# Patient Record
Sex: Male | Born: 1960
Health system: Southern US, Community
[De-identification: ages and names within clinical notes are randomized; demographics above are authoritative.]

## PROBLEM LIST (undated history)

## (undated) DIAGNOSIS — G039 Meningitis, unspecified: Secondary | ICD-10-CM

## (undated) DIAGNOSIS — Z8619 Personal history of other infectious and parasitic diseases: Secondary | ICD-10-CM

## (undated) DIAGNOSIS — L309 Dermatitis, unspecified: Secondary | ICD-10-CM

## (undated) DIAGNOSIS — H9190 Unspecified hearing loss, unspecified ear: Secondary | ICD-10-CM

## (undated) HISTORY — PX: DENTAL SURGERY: SHX609

## (undated) HISTORY — PX: MIDDLE EAR SURGERY: SHX713

---

## 1997-06-04 HISTORY — PX: BACK SURGERY: SHX140

## 1998-01-18 ENCOUNTER — Emergency Department (HOSPITAL_COMMUNITY): Admission: EM | Admit: 1998-01-18 | Discharge: 1998-01-18 | Payer: Self-pay | Admitting: Emergency Medicine

## 1998-01-19 ENCOUNTER — Emergency Department (HOSPITAL_COMMUNITY): Admission: EM | Admit: 1998-01-19 | Discharge: 1998-01-19 | Payer: Self-pay | Admitting: Emergency Medicine

## 1998-07-16 ENCOUNTER — Emergency Department (HOSPITAL_COMMUNITY): Admission: EM | Admit: 1998-07-16 | Discharge: 1998-07-16 | Payer: Self-pay | Admitting: Emergency Medicine

## 1998-08-26 ENCOUNTER — Encounter: Payer: Self-pay | Admitting: Neurosurgery

## 1998-08-29 ENCOUNTER — Encounter: Payer: Self-pay | Admitting: Neurosurgery

## 1998-08-29 ENCOUNTER — Inpatient Hospital Stay (HOSPITAL_COMMUNITY): Admission: RE | Admit: 1998-08-29 | Discharge: 1998-08-30 | Payer: Self-pay | Admitting: Neurosurgery

## 2002-09-18 ENCOUNTER — Ambulatory Visit (HOSPITAL_BASED_OUTPATIENT_CLINIC_OR_DEPARTMENT_OTHER): Admission: RE | Admit: 2002-09-18 | Discharge: 2002-09-18 | Payer: Self-pay | Admitting: Orthopedic Surgery

## 2006-12-14 ENCOUNTER — Emergency Department (HOSPITAL_COMMUNITY): Admission: EM | Admit: 2006-12-14 | Discharge: 2006-12-14 | Payer: Self-pay | Admitting: Family Medicine

## 2007-12-27 ENCOUNTER — Emergency Department (HOSPITAL_BASED_OUTPATIENT_CLINIC_OR_DEPARTMENT_OTHER): Admission: EM | Admit: 2007-12-27 | Discharge: 2007-12-27 | Payer: Self-pay | Admitting: Emergency Medicine

## 2008-09-03 ENCOUNTER — Emergency Department (HOSPITAL_COMMUNITY): Admission: EM | Admit: 2008-09-03 | Discharge: 2008-09-04 | Payer: Self-pay | Admitting: Emergency Medicine

## 2009-02-22 ENCOUNTER — Emergency Department (HOSPITAL_COMMUNITY): Admission: EM | Admit: 2009-02-22 | Discharge: 2009-02-22 | Payer: Self-pay | Admitting: Emergency Medicine

## 2010-06-02 ENCOUNTER — Encounter
Admission: RE | Admit: 2010-06-02 | Discharge: 2010-06-02 | Payer: Self-pay | Source: Home / Self Care | Attending: Otolaryngology | Admitting: Otolaryngology

## 2010-06-29 ENCOUNTER — Ambulatory Visit
Admission: RE | Admit: 2010-06-29 | Discharge: 2010-06-30 | Payer: Self-pay | Source: Home / Self Care | Attending: Otolaryngology | Admitting: Otolaryngology

## 2010-06-29 LAB — POCT HEMOGLOBIN-HEMACUE: Hemoglobin: 17.3 g/dL — ABNORMAL HIGH (ref 13.0–17.0)

## 2010-07-06 ENCOUNTER — Ambulatory Visit (INDEPENDENT_AMBULATORY_CARE_PROVIDER_SITE_OTHER): Payer: Self-pay | Admitting: Otolaryngology

## 2010-07-13 NOTE — Op Note (Signed)
Russell Warren, Russell Warren             ACCOUNT NO.:  [id number]  MEDICAL RECORD NO.:  [id number]          PATIENT TYPE:  AMB  LOCATION:  DSC                          FACILITY:  [facility]  PHYSICIAN:  Newman Pies, MD            DATE OF BIRTH:  1960-07-17  DATE OF PROCEDURE:  06/29/2010 DATE OF DISCHARGE:                              OPERATIVE REPORT   SURGEON:  Newman Pies, MD.  PREOPERATIVE DIAGNOSIS:  Left chronic otomastoiditis.  POSTOPERATIVE DIAGNOSES: 1. Left chronic otomastoiditis. 2. 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  PROCEDURE PERFORMED: 1. Canal wall down tympanomastoidectomy with ossicular chain     reconstruction (incus interposition graft). 2. Microscope Microdissection.  ANESTHESIA:  General endotracheal tube anesthesia.  COMPLICATIONS:  None.  ESTIMATED BLOOD LOSS:  Less than 50 mL.  INDICATIONS FOR PROCEDURE:  The patient is a 50 year old male with a history of chronic left ear infections and hearing loss.  It should be noted that the patient previously had a history of right tympanomastoidectomy as a child to treat his chronic right ear infections.  Over the past several years, he has been having progressive hearing loss on the left side.  He also complains of significant tinnitus on the left side.  He has also been experiencing recurrent drainage in the left ear.  On examination, he was noted to have chronic purulent drainage from the left ear canal.  He was treated with multiple courses of antibiotics without improvement in his symptoms.  His tympanic membrane was noted to be severely retracted, with possible erosion of his ossicles.  A temporal bone CT scan was performed.  It showed complete opacification of his mastoid air cells as well as opacification of the left mastoid cavity.  The findings were suggestive of chronic oral mastoiditis with possible cholesteatoma.  Based on the above findings, the decision was made for the patient  to undergo the left tympanomastoidectomy surgery.  The risks, benefits, alternatives, and details of the procedure were discussed with the patient and his wife.  Questions were invited and answered.  Informed consent was obtained.  DESCRIPTION:  The patient was taken to the operating room and placed supine on the operating table.  General endotracheal tube anesthesia was administered by the anesthesiologist.  The patient was positioned, and prepped and draped in standard fashion for left ear surgery.  Facial nerve monitoring electrodes were placed.  The facial nerve system was functional throughout the case.  Under the operating microscope, the left ear canal was cleaned of all cerumen.  The patient's left tympanic membrane was noted to be severely retracted.  A small amount of purulent drainage was noted.  A standard tympanomeatal flap was elevated in the standard fashion.  The retracted tympanic membrane was carefully elevated.  At this time, the long process of the incus was noted to be completely eroded.  However, the stapes was still intact.  The malleus and the incus were subsequently removed.  A large amount of granulation tissue was noted to occupy the superior portion of the middle ear cavity.  Attention was then focused on  the mastoid.  A standard cortical mastoidectomy was performed in the standard fashion.  The tegmen and the sigmoid sinus were identified and preserved.  The antrum was subsequently identified and enlarged.  Chronic mucosal polypoid tissue was noted.  It was carefully removed.  At this point, it was noted that the retracted tympanic membrane was noted to be draping the sinus tympani.  In order to completely remove all the squamous tissue and infection, the decision was made to perform the canal wall down procedure.  The posterior canal wall was carefully removed.  The bone ridge was lowered to the level of the facial nerve.  The polypoid tissue around the  stapes was carefully removed.  The mastoid cavity and the middle ear cavity were irrigated with Ciprodex antibiotic solution.  In order to reconstruct the hearing apparatus, the head of the incus was carefully drilled and fashioned into an incus interposition graft.  The mushroom cap-shaped interposition graft was then placed on top of the stapes suprastructure.  Temporalis fascia graft was then harvested in a standard fashion.  The fascial graft was used to reconstruct the new tympanum.  The new tympanic membrane was secured in place with Gelfoam packing on both sides of the new eardrum.  The new tympanum was then used to cover the incus interposition graft.  A standard meatoplasty was performed to significantly enlarge the size of the ear canal.  The incision was closed in layers with 4-0 Vicryl and Dermabond sutures.  A Glasscock dressing was applied.  That concluded the procedure for the patient.  The care of the patient was turned over to the anesthesiologist.  The patient was awakened from anesthesia without difficulty.  He was extubated and transferred to the recovery room in good condition.  OPERATIVE FINDINGS:  Chronic left oral mastoiditis, with severe tympanic membrane retraction and erosion of the long process of the incus.  An incus interposition graft was used to reconstruct the ossicular chain.  SPECIMEN:  None.  FOLLOWUP CARE:  The patient will be discharged home once he is awake and alert.  He will be placed on Percocet 1-2 tablets p.o. q.4-6 h. p.r.n. pain, and ciprofloxacin 500 mg p.o. b.i.d. for 5 days.  The patient will follow up in my office in approximately 1 week.     Newman Pies, MD     ST/MEDQ  D:  06/29/2010  T:  06/29/2010  Job:  [id number]  Electronically Signed by Newman Pies MD on 07/13/2010 12:31:20 PM

## 2010-10-20 NOTE — Op Note (Signed)
NAME:  Russell Warren, Russell Warren                       ACCOUNT NO.:  [id number]   MEDICAL RECORD NO.:  [id number]                   PATIENT TYPE:  AMB   LOCATION:  DSC                                  FACILITY:  [facility]   PHYSICIAN:  Harvie Junior, M.D.                DATE OF BIRTH:  20-Jul-1960   DATE OF PROCEDURE:  09/18/2002  DATE OF DISCHARGE:                                 OPERATIVE REPORT   PREOPERATIVE DIAGNOSIS:  Medial meniscal tear.   POSTOPERATIVE DIAGNOSIS:  Medial meniscal tear.   PROCEDURE:  Operative knee arthroscopy with debridement of bucket handle  medial meniscal tear.   SURGEON:  Harvie Junior, M.D.   ASSISTANT:  Marshia Ly, P.A.   ANESTHESIA:  General.   BRIEF HISTORY:  This is a 50 year old male with a long history of having had  significant medial-side pain.  He ultimately had a twisting maneuver and the  knee locked him on and he was unable to straighten it, was not able to bend  it.  Because of significant continued complaints of pain, the patient was  brought to the operating room for operative knee arthroscopy.  MRI was  obtained preoperatively by Scherrie Bateman, M.D., and showed a medial meniscal  tear.   DESCRIPTION OF PROCEDURE:  The patient was brought to the operating room and  after adequate anesthesia was obtained with a general anesthetic, the  patient was placed supine upon the operating table.  The right leg was then  prepped and draped in the usual sterile fashion.  Following this, routine  arthroscopic examination of the knee was undertaken and noted to have a  bucket handle medial meniscal tear attached from the front and the side  around to the back.  It was locked in the knee.  The bucket handle tear was  reduced, detached from the back, detached from the front, and removed in  total.  A 4 mm meniscal rim was left and about 5-6 posteriorly.  The knee  was copiously irrigated at this point.  The medial femoral condyle had some  area of  excessive wear in the extension area.  This was debrided to a smooth  and stable rim.  Following debridement of this, attention was turned to the  anterior cruciate, which was normal, lateral side normal, the patellofemoral  articulation was within normal limits.  At this point a final check was made  of the medial side.  The remaining small remnants of meniscus were debrided  with the suction shaver back to a smooth and stable rim.  Following this the  knee was copiously irrigated and suctioned dry, and the portals were then  closed with a bandage.  A sterile and compressive dressing was applied and  the patient taken to the recovery room, where he was noted to be in  satisfactory condition.  Estimated blood loss for the procedure was none.  Harvie Junior, M.D.   Ranae Plumber  D:  09/18/2002  T:  09/18/2002  Job:  [id number]   cc:   [facility] Scherrie Bateman, M.D.

## 2015-06-04 ENCOUNTER — Encounter (HOSPITAL_BASED_OUTPATIENT_CLINIC_OR_DEPARTMENT_OTHER): Payer: Self-pay | Admitting: *Deleted

## 2015-06-04 ENCOUNTER — Emergency Department (HOSPITAL_BASED_OUTPATIENT_CLINIC_OR_DEPARTMENT_OTHER)
Admission: EM | Admit: 2015-06-04 | Discharge: 2015-06-04 | Disposition: A | Discharge status: Home / Self Care | Payer: Self-pay | Attending: Emergency Medicine | Admitting: Emergency Medicine

## 2015-06-04 DIAGNOSIS — H9212 Otorrhea, left ear: Secondary | ICD-10-CM | POA: Insufficient documentation

## 2015-06-04 DIAGNOSIS — H6091 Unspecified otitis externa, right ear: Secondary | ICD-10-CM

## 2015-06-04 MED ORDER — CIPROFLOXACIN-DEXAMETHASONE 0.3-0.1 % OT SUSP
4.0000 [drp] | Freq: Once | OTIC | Status: AC
  Administered 2015-06-04: 4 [drp] via OTIC
  Filled 2015-06-04: qty 7.5

## 2015-06-04 NOTE — ED Provider Notes (Signed)
CSN: [id number]     Arrival date & time 06/04/15  1006 History   First MD Initiated Contact with Patient 06/04/15 1018     Chief Complaint  Patient presents with  . Otalgia     (Consider location/radiation/quality/duration/timing/severity/associated sxs/prior Treatment) HPI  54 year old male presents with right ear pain for the past 2 days. Has been having left and right ear drainage for at least 2 months. However he states there is now pus like fluid coming out of his right ear and his ear feels swollen and painful. Decreased hearing in that ear. Patient states that closing the jaw and the right side causes pain in his ear. No fevers. Has had bilateral mastoidectomies, once in his right for meningitis as a child and the one on his left a few years ago by Dr. Suszanne Connerseoh for recurrent ear issues. No left ear pain, just drainage. No headaches. Has not followed up with ENT due to a remaining outstanding balance. Has had recurrent right otitis externa.  History reviewed. No pertinent past medical history. Past Surgical History  Procedure Laterality Date  . Middle ear surgery Bilateral    No family history on file. Social History  Substance Use Topics  . Smoking status: Never Smoker   . Smokeless tobacco: None  . Alcohol Use: No    Review of Systems  Constitutional: Negative for fever.  HENT: Positive for ear discharge and ear pain.   Neurological: Negative for headaches.  All other systems reviewed and are negative.     Allergies  Review of patient's allergies indicates no known allergies.  Home Medications   Prior to Admission medications   Not on File   BP 136/102 mmHg  Pulse 86  Temp(Src) 98 F (36.7 C) (Oral)  Resp 18  Ht 5' 11.5" (1.816 m)  Wt 215 lb (97.523 kg)  BMI 29.57 kg/m2  SpO2 98% Physical Exam  Constitutional: He is oriented to person, place, and time. He appears well-developed and well-nourished.  HENT:  Head: Normocephalic and atraumatic.  Right Ear:  There is drainage, swelling and tenderness.  Left Ear: External ear normal.  Nose: Nose normal.  Clear fluid in left ear canal, unable to see TM Right ear canal with swelling and pustular drainage. Pain with auricle movement  Eyes: Right eye exhibits no discharge. Left eye exhibits no discharge.  Neck: Normal range of motion. Neck supple.  Cardiovascular: Normal rate, regular rhythm, normal heart sounds and intact distal pulses.   Pulmonary/Chest: Effort normal and breath sounds normal.  Abdominal: He exhibits no distension.  Neurological: He is alert and oriented to person, place, and time.  Skin: Skin is warm and dry.  Nursing note and vitals reviewed.   ED Course  Procedures (including critical care time) Labs Review Labs Reviewed - No data to display  Imaging Review No results found. I have personally reviewed and evaluated these images and lab results as part of my medical decision-making.   EKG Interpretation None      MDM   Final diagnoses:  Right otitis externa    Patient with recurrent right otitis externa. Shonna ChockWick will be placed and he will be given Ciprodex. Patient otherwise has no concerning findings for more systemic disease or other acute illness. This is a recurrent issue. I stressed the importance of following up with ENT. Discussed return precautions.    Pricilla LovelessScott Lashona Schaaf, MD 06/04/15 [phone]

## 2015-06-04 NOTE — Discharge Instructions (Signed)
Use the Ciprodex drops 4 drops in the right ear 2 times per day for 7 days. Use this to completion even if your symptoms improve. If her symptoms worsen return to emergency department or see the ear nose and throat specialist.

## 2015-06-04 NOTE — ED Notes (Signed)
Patient c/o bilateral ear drainage for the past month & severe ear pain in the right side

## 2015-09-13 ENCOUNTER — Encounter (HOSPITAL_BASED_OUTPATIENT_CLINIC_OR_DEPARTMENT_OTHER): Payer: Self-pay | Admitting: *Deleted

## 2015-09-13 ENCOUNTER — Emergency Department (HOSPITAL_BASED_OUTPATIENT_CLINIC_OR_DEPARTMENT_OTHER): Payer: Self-pay

## 2015-09-13 ENCOUNTER — Emergency Department (HOSPITAL_BASED_OUTPATIENT_CLINIC_OR_DEPARTMENT_OTHER)
Admission: EM | Admit: 2015-09-13 | Discharge: 2015-09-13 | Disposition: A | Discharge status: Home / Self Care | Payer: Self-pay | Attending: Emergency Medicine | Admitting: Emergency Medicine

## 2015-09-13 DIAGNOSIS — W230XXA Caught, crushed, jammed, or pinched between moving objects, initial encounter: Secondary | ICD-10-CM | POA: Insufficient documentation

## 2015-09-13 DIAGNOSIS — Y929 Unspecified place or not applicable: Secondary | ICD-10-CM | POA: Insufficient documentation

## 2015-09-13 DIAGNOSIS — Y939 Activity, unspecified: Secondary | ICD-10-CM | POA: Insufficient documentation

## 2015-09-13 DIAGNOSIS — S61219A Laceration without foreign body of unspecified finger without damage to nail, initial encounter: Secondary | ICD-10-CM

## 2015-09-13 DIAGNOSIS — S61217A Laceration without foreign body of left little finger without damage to nail, initial encounter: Secondary | ICD-10-CM | POA: Insufficient documentation

## 2015-09-13 DIAGNOSIS — T1490XA Injury, unspecified, initial encounter: Secondary | ICD-10-CM

## 2015-09-13 DIAGNOSIS — Y999 Unspecified external cause status: Secondary | ICD-10-CM | POA: Insufficient documentation

## 2015-09-13 MED ORDER — IBUPROFEN 800 MG PO TABS
800.0000 mg | ORAL_TABLET | Freq: Once | ORAL | Status: AC
  Administered 2015-09-13: 800 mg via ORAL
  Filled 2015-09-13: qty 1

## 2015-09-13 MED ORDER — DOXYCYCLINE HYCLATE 100 MG PO CAPS: 100.0000 mg | ORAL_CAPSULE | Freq: Two times a day (BID) | ORAL | Status: DC

## 2015-09-13 MED ORDER — MUPIROCIN CALCIUM 2 % EX CREA: 1.0000 "application " | TOPICAL_CREAM | Freq: Two times a day (BID) | CUTANEOUS | Status: DC

## 2015-09-13 MED ORDER — MUPIROCIN 2 % EX OINT: 1.0000 "application " | TOPICAL_OINTMENT | Freq: Two times a day (BID) | CUTANEOUS | Status: DC

## 2015-09-13 NOTE — Discharge Instructions (Signed)
Wound Care °Taking care of your wound properly can help to prevent pain and infection. It can also help your wound to heal more quickly.  °HOW TO CARE FOR YOUR WOUND  °· Take or apply over-the-counter and prescription medicines only as told by your health care provider. °· If you were prescribed antibiotic medicine, take or apply it as told by your health care provider. Do not stop using the antibiotic even if your condition improves. °· Clean the wound each day or as told by your health care provider. °¨ Wash the wound with mild soap and water. °¨ Rinse the wound with water to remove all soap. °¨ Pat the wound dry with a clean towel. Do not rub it. °· There are many different ways to close and cover a wound. For example, a wound can be covered with stitches (sutures), skin glue, or adhesive strips. Follow instructions from your health care provider about: °¨ How to take care of your wound. °¨ When and how you should change your bandage (dressing). °¨ When you should remove your dressing. °¨ Removing whatever was used to close your wound. °· Check your wound every day for signs of infection. Watch for: °¨ Redness, swelling, or pain. °¨ Fluid, blood, or pus. °· Keep the dressing dry until your health care provider says it can be removed. Do not take baths, swim, use a hot tub, or do anything that would put your wound underwater until your health care provider approves. °· Raise (elevate) the injured area above the level of your heart while you are sitting or lying down. °· Do not scratch or pick at the wound. °· Keep all follow-up visits as told by your health care provider. This is important. °SEEK MEDICAL CARE IF: °· You received a tetanus shot and you have swelling, severe pain, redness, or bleeding at the injection site. °· You have a fever. °· Your pain is not controlled with medicine. °· You have increased redness, swelling, or pain at the site of your wound. °· You have fluid, blood, or pus coming from your  wound. °· You notice a bad smell coming from your wound or your dressing. °SEEK IMMEDIATE MEDICAL CARE IF: °· You have a red streak going away from your wound. °  °This information is not intended to replace advice given to you by your health care provider. Make sure you discuss any questions you have with your health care provider. °  °Document Released: 02/28/2008 Document Revised: 10/05/2014 Document Reviewed: 05/17/2014 °Elsevier Interactive Patient Education ©2016 [employer] ° °

## 2015-09-13 NOTE — ED Provider Notes (Signed)
CSN: [id number]     Arrival date & time 09/13/15  1827 History   First MD Initiated Contact with Patient 09/13/15 1920     Chief Complaint  Patient presents with  . Finger Injury   HPI  Mr. Frasier is a 55 year old male presenting with a finger injury. Injury occurred 3 days ago. His left fifth digit was slammed in a metal door. He reports a laceration across the distal finger pad. He put hydrogen peroxide on it and wrapped in gauze. He states that he assumed the wound would heal on its own. He unwrapped the finger today and noted pus and redness of the finger. He cleaned the finger under water and presented to the emergency department. He endorses pain in the fingertip. He denies a history of diabetes or other diseases resulting in immunocompromise or slow wound healing. He states that his fingertip appears red but denies redness of the proximal finger. He denies loss of range of motion in the finger. He denies systemic symptoms including fever, chills, dizziness, syncope, nausea or vomiting. He has no other complaints today. He believes he is up-to-date on his tetanus.  History reviewed. No pertinent past medical history. Past Surgical History  Procedure Laterality Date  . Middle ear surgery Bilateral    History reviewed. No pertinent family history. Social History  Substance Use Topics  . Smoking status: Never Smoker   . Smokeless tobacco: None  . Alcohol Use: No    Review of Systems  Skin: Positive for wound.  All other systems reviewed and are negative.     Allergies  Review of patient's allergies indicates no known allergies.  Home Medications   Prior to Admission medications   Medication Sig Start Date End Date Taking? Authorizing Provider  doxycycline (VIBRAMYCIN) 100 MG capsule Take 1 capsule (100 mg total) by mouth 2 (two) times daily. 09/13/15   Vickii Volland, PA-C  doxycycline (VIBRAMYCIN) 100 MG capsule Take 1 capsule (100 mg total) by mouth 2 (two) times daily.  09/13/15   Shaquana Buel, PA-C  mupirocin cream (BACTROBAN) 2 % Apply 1 application topically 2 (two) times daily. 09/13/15   Jaydn Moscato, PA-C   BP 134/94 mmHg  Pulse 72  Temp(Src) 97.9 F (36.6 C) (Oral)  Resp 18  Ht 5' 11.5" (1.816 m)  Wt 99.791 kg  BMI 30.26 kg/m2  SpO2 100% Physical Exam  Constitutional: He appears well-developed and well-nourished. No distress.  HENT:  Head: Normocephalic and atraumatic.  Right Ear: External ear normal.  Left Ear: External ear normal.  Eyes: Conjunctivae are normal. Right eye exhibits no discharge. Left eye exhibits no discharge. No scleral icterus.  Neck: Normal range of motion.  Cardiovascular: Normal rate and intact distal pulses.   Cap refill less than 3 seconds.  Pulmonary/Chest: Effort normal.  Musculoskeletal: Normal range of motion.  Full range of motion of the left fifth digit. Minor swelling noted to the distal fingertip. Tenderness to palpation over the left distal fingertip.  Neurological: He is alert. Coordination normal.  Sensation to light touch intact over the left fifth digit distal fingertip  Skin: Skin is warm and dry.  3 cm laceration noted to the distal left fifth digit palmar side. Laceration is linear and wound edges are approximated well. Depth approximately 5 mm. Surrounding erythema of the distal fingertip without streaking towards the proximal finger. No purulence noted currently. No active bleeding at this time. Given length of time since injury, suture repair will not be performed and wound  will heal by secondary intention.  Psychiatric: He has a normal mood and affect. His behavior is normal.  Nursing note and vitals reviewed.   ED Course  Procedures (including critical care time) Labs Review Labs Reviewed - No data to display  Imaging Review Dg Finger Little Left  09/13/2015  CLINICAL DATA:  Left ring finger caught in a sliding door EXAM: LEFT LITTLE FINGER 2+V COMPARISON:  None. FINDINGS: There is no  evidence of fracture or dislocation. There is no evidence of arthropathy or other focal bone abnormality. There is a small soft tissue laceration along the inner aspect of the fifth phalanx. IMPRESSION: No acute osseous injury of the left fifth digit. Electronically Signed   By: Elige KoHetal  Patel   On: 09/13/2015 20:00   I have personally reviewed and evaluated these images and lab results as part of my medical decision-making.   EKG Interpretation None      MDM   Final diagnoses:  Finger laceration, initial encounter   55 year old male presenting with laceration to left fifth digit which occurred 3 days prior. Afebrile and nontoxic appearing. 3 cm laceration noted to the left fifth digit distal finger. Surrounding erythema without streaking. No purulence noted. Full range of motion intact. Sensation intact. Finger x-ray negative for fracture. Given length of time since the injury, suture repair will not be performed. Will discharge patient with doxycycline and topical Bactroban. Patient is to follow-up with his PCP or an urgent care in 2 days for a wound recheck. I have also discussed reasons to return immediately to the emergency department. Patient is stable for discharge.    Rolm GalaStevi Demarie Uhlig, PA-C 09/13/15 2233  Geoffery Lyonsouglas Delo, MD 09/13/15 2257

## 2015-09-13 NOTE — ED Notes (Signed)
Finger noted to in swollen, appears infected.

## 2015-09-13 NOTE — ED Notes (Signed)
Left little finger injury Saturday night.  Slammed finger in door.

## 2015-11-29 ENCOUNTER — Encounter (HOSPITAL_BASED_OUTPATIENT_CLINIC_OR_DEPARTMENT_OTHER): Payer: Self-pay | Admitting: Emergency Medicine

## 2015-11-29 ENCOUNTER — Emergency Department (HOSPITAL_BASED_OUTPATIENT_CLINIC_OR_DEPARTMENT_OTHER)
Admission: EM | Admit: 2015-11-29 | Discharge: 2015-11-29 | Disposition: A | Discharge status: Home / Self Care | Payer: Self-pay | Attending: Emergency Medicine | Admitting: Emergency Medicine

## 2015-11-29 DIAGNOSIS — L0291 Cutaneous abscess, unspecified: Secondary | ICD-10-CM

## 2015-11-29 DIAGNOSIS — L02415 Cutaneous abscess of right lower limb: Secondary | ICD-10-CM | POA: Insufficient documentation

## 2015-11-29 DIAGNOSIS — L03115 Cellulitis of right lower limb: Secondary | ICD-10-CM | POA: Insufficient documentation

## 2015-11-29 DIAGNOSIS — L0390 Cellulitis, unspecified: Secondary | ICD-10-CM

## 2015-11-29 MED ORDER — SULFAMETHOXAZOLE-TRIMETHOPRIM 800-160 MG PO TABS: 1.0000 | ORAL_TABLET | Freq: Two times a day (BID) | ORAL | Status: DC

## 2015-11-29 NOTE — ED Provider Notes (Signed)
CSN: [id number]     Arrival date & time 11/29/15  1826 History  By signing my name below, I, [facility], attest that this documentation has been prepared under the direction and in the presence of Melene Planan Niang Mitcheltree, DO. Electronically Signed: Randell PatientMarrissa Washington, ED Scribe. 11/29/2015. 8:57 PM.   Chief Complaint  Patient presents with  . Cellulitis    The history is provided by the patient. No language interpreter was used.   HPI Comments: Loleta ChanceBartley A Lukehart is a 55 y.o. male who presents to the Emergency Department complaining of constant, gradually increasing in swelling and erythema, abscess to his right, anterior, lower leg that he noticed yesterday. 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 He reports associated pain to the abscess when the area is palpated. NKDA. Denies IV drug use. Denies fevers, arthralgias, nausea, pus drainage from the affected area, tick bites, or any other symptom currently.  History reviewed. No pertinent past medical history. Past Surgical History  Procedure Laterality Date  . Middle ear surgery Bilateral    History reviewed. No pertinent family history. Social History  Substance Use Topics  . Smoking status: Never Smoker   . Smokeless tobacco: None  . Alcohol Use: No    Review of Systems  Constitutional: Negative for fever and chills.  HENT: Negative for congestion and facial swelling.   Eyes: Negative for discharge and visual disturbance.  Respiratory: Negative for shortness of breath.   Cardiovascular: Negative for chest pain and palpitations.  Gastrointestinal: Negative for nausea, vomiting, abdominal pain and diarrhea.  Musculoskeletal: Negative for myalgias and arthralgias.  Skin: Positive for color change and wound. Negative for rash.  Neurological: Negative for tremors, syncope and headaches.  Psychiatric/Behavioral: Negative for  confusion and dysphoric mood.  All other systems reviewed and are negative.     Allergies  Review of patient's allergies indicates no known allergies.  Home Medications   Prior to Admission medications   Medication Sig Start Date End Date Taking? Authorizing Provider  doxycycline (VIBRAMYCIN) 100 MG capsule Take 1 capsule (100 mg total) by mouth 2 (two) times daily. 09/13/15   Stevi Barrett, PA-C  doxycycline (VIBRAMYCIN) 100 MG capsule Take 1 capsule (100 mg total) by mouth 2 (two) times daily. 09/13/15   Stevi Barrett, PA-C  mupirocin cream (BACTROBAN) 2 % Apply 1 application topically 2 (two) times daily. 09/13/15   Stevi Barrett, PA-C  sulfamethoxazole-trimethoprim (BACTRIM DS,SEPTRA DS) 800-160 MG tablet Take 1 tablet by mouth 2 (two) times daily. 11/29/15   Melene Planan Jaken Fregia, DO   BP 106/87 mmHg  Pulse 87  Temp(Src) 98.7 F (37.1 C) (Oral)  Resp 18  Ht 6' (1.829 m)  Wt 212 lb (96.163 kg)  BMI 28.75 kg/m2  SpO2 100% Physical Exam  Constitutional: He is oriented to person, place, and time. He appears well-developed and well-nourished.  HENT:  Head: Normocephalic and atraumatic.  Eyes: EOM are normal. Pupils are equal, round, and reactive to light.  Neck: Normal range of motion. Neck supple. No JVD present.  Cardiovascular: Normal rate and regular rhythm.  Exam reveals no gallop and no friction rub.   No murmur heard. Pulmonary/Chest: No respiratory distress. He has no wheezes.  Abdominal: He exhibits no distension. There is no rebound and no guarding.  Musculoskeletal: Normal range of motion.  Neurological: He is alert and oriented to person, place, and time.  Skin: No rash  noted. No pallor.  Multiple lesions, mostly on hands and forearms, that are nodular, round and erythematous, with small pustule to the surface. Lesion to the right mid tibia that is surrounded dinner plates worth of erythema. fluctuance and purulent drainage from the area.  Psychiatric: He has a normal mood and  affect. His behavior is normal.  Nursing note and vitals reviewed.   ED Course  .Marland Kitchen.Incision and Drainage Date/Time: 11/29/2015 11:26 PM Performed by: Adela LankFLOYD, Chereese Cilento Authorized by: Melene PlanFLOYD, Idriss Quackenbush Consent: Verbal consent obtained. Risks and benefits: risks, benefits and alternatives were discussed Consent given by: patient Required items: required blood products, implants, devices, and special equipment available Patient identity confirmed: verbally with patient Type: abscess Body area: lower extremity Location details: right leg Patient sedated: no Needle gauge: 18 Incision type: elliptical Incision depth: dermal Complexity: simple Drainage: purulent Drainage amount: moderate Wound treatment: wound left open Patient tolerance: Patient tolerated the procedure well with no immediate complications     DIAGNOSTIC STUDIES: Oxygen Saturation is 100% on RA, normal by my interpretation.    COORDINATION OF CARE: 8:32 PM Will prescribe Bactrim. Will discharge pt. Discussed treatment plan with pt at bedside and pt agreed to plan.    MDM   Final diagnoses:  Cellulitis and abscess    55 yo M with leg pain and swelling. Abscess clinically, I&D with some purulent drainage.  Patient with multiple lesions, will give course of abx.  PCP follow up.  I personally performed the services described in this documentation, which was scribed in my presence. The recorded information has been reviewed and is accurate.   11:26 PM:  I have discussed the diagnosis/risks/treatment options with the patient and family and believe the pt to be eligible for discharge home to follow-up with PCP. We also discussed returning to the ED immediately if new or worsening sx occur. We discussed the sx which are most concerning (e.g., sudden worsening pain, fever, inability to tolerate by mouth) that necessitate immediate return. Medications administered to the patient during their visit and any new prescriptions provided to  the patient are listed below.  Medications given during this visit Medications - No data to display  Discharge Medication List as of 11/29/2015  8:52 PM    START taking these medications   Details  sulfamethoxazole-trimethoprim (BACTRIM DS,SEPTRA DS) 800-160 MG tablet Take 1 tablet by mouth 2 (two) times daily., Starting 11/29/2015, Until Discontinued, Print        The patient appears reasonably screen and/or stabilized for discharge and I doubt any other medical condition or other [facility] requiring further screening, evaluation, or treatment in the ED at this time prior to discharge.    Melene Planan Mahli Glahn, DO 11/29/15 2326

## 2015-11-29 NOTE — ED Notes (Signed)
C/o scab to rt shin onset yesterday  Today has increased swelling, redness and pain to area,  Also has several scabbed areas to hands and arms w pus draining

## 2015-11-29 NOTE — Discharge Instructions (Signed)
Warm compresses 4 times a day.  Abscess An abscess (boil or furuncle) is an infected area on or under the skin. This area is filled with yellowish-white fluid (pus) and other material (debris). HOME CARE   Only take medicines as told by your doctor.  If you were given antibiotic medicine, take it as directed. Finish the medicine even if you start to feel better.  If gauze is used, follow your doctor's directions for changing the gauze.  To avoid spreading the infection:  Keep your abscess covered with a bandage.  Wash your hands well.  Do not share personal care items, towels, or whirlpools with others.  Avoid skin contact with others.  Keep your skin and clothes clean around the abscess.  Keep all doctor visits as told. GET HELP RIGHT AWAY IF:   You have more pain, puffiness (swelling), or redness in the wound site.  You have more fluid or blood coming from the wound site.  You have muscle aches, chills, or you feel sick.  You have a fever. MAKE SURE YOU:   Understand these instructions.  Will watch your condition.  Will get help right away if you are not doing well or get worse.   This information is not intended to replace advice given to you by your health care provider. Make sure you discuss any questions you have with your health care provider.   Document Released: 11/07/2007 Document Revised: 11/20/2011 Document Reviewed: 08/04/2011 Elsevier Interactive Patient Education [employer]2016 [employer]vier Inc.

## 2015-11-29 NOTE — ED Notes (Signed)
Patient has a large area of cellulitis to his right shin, and an area of concern to his left arm. The patient reports that this just showed up today. Reports that he has eczema but he does not recall any injury to his shin

## 2016-04-14 ENCOUNTER — Emergency Department (HOSPITAL_BASED_OUTPATIENT_CLINIC_OR_DEPARTMENT_OTHER)
Admission: EM | Admit: 2016-04-14 | Discharge: 2016-04-14 | Disposition: A | Discharge status: Home / Self Care | Payer: Self-pay | Attending: Emergency Medicine | Admitting: Emergency Medicine

## 2016-04-14 ENCOUNTER — Encounter (HOSPITAL_BASED_OUTPATIENT_CLINIC_OR_DEPARTMENT_OTHER): Payer: Self-pay | Admitting: *Deleted

## 2016-04-14 DIAGNOSIS — L309 Dermatitis, unspecified: Secondary | ICD-10-CM | POA: Insufficient documentation

## 2016-04-14 HISTORY — DX: Dermatitis, unspecified: L30.9

## 2016-04-14 MED ORDER — DEXAMETHASONE SODIUM PHOSPHATE 10 MG/ML IJ SOLN
10.0000 mg | Freq: Once | INTRAMUSCULAR | Status: AC
  Administered 2016-04-14: 10 mg via INTRAMUSCULAR
  Filled 2016-04-14: qty 1

## 2016-04-14 NOTE — ED Triage Notes (Signed)
Pt reports that has chronic eczema, states that he went to the dermatologist yesterday and was given a shampoo to use but states that it burns.  Reports that he needs a steroid shot to help clear it up.

## 2016-04-14 NOTE — ED Notes (Signed)
ED Provider at bedside. 

## 2016-04-14 NOTE — Discharge Instructions (Signed)
Please read and follow all provided instructions.  Your diagnoses today include:  1. Eczema, unspecified type    Tests performed today include: Vital signs. See below for your results today.   Medications prescribed:  Take as prescribed   Home care instructions:  Follow any educational materials contained in this packet.  Follow-up instructions: Please follow-up with your Dermatologist for further evaluation of symptoms and treatment   Return instructions:  Please return to the Emergency Department if you do not get better, if you get worse, or new symptoms OR  - Fever (temperature greater than 101.88F)  - Bleeding that does not stop with holding pressure to the area    -Severe pain (please note that you may be more sore the day after your accident)  - Chest Pain  - Difficulty breathing  - Severe nausea or vomiting  - Inability to tolerate food and liquids  - Passing out  - Skin becoming red around your wounds  - Change in mental status (confusion or lethargy)  - New numbness or weakness    Please return if you have any other emergent concerns.  Additional Information:  Your vital signs today were: BP 148/99 (BP Location: Right Arm)    Pulse 88    Temp 98 F (36.7 C) (Oral)    Resp 20    Ht 5' 11.5" (1.816 m)    Wt 95.3 kg    SpO2 99%    BMI 28.88 kg/m  If your blood pressure (BP) was elevated above 135/85 this visit, please have this repeated by your doctor within one month. ---------------

## 2016-04-14 NOTE — ED Provider Notes (Signed)
[facility] Provider Note   CSN: [id number] Arrival date & time: 04/14/16  1731  By signing my name below, I, Vista Minkobert Ross, attest that this documentation has been prepared under the direction and in the presence of Audry Piliyler Danese Dorsainvil PA-C.  Electronically Signed: Vista Minkobert Ross, ED Scribe. 04/14/16. 7:00 PM.   History   Chief Complaint Chief Complaint  Patient presents with  . Eczema    HPI HPI Comments: Russell Warren is a 55 y.o. male who presents to the Emergency Department complaining of generalized, diffuse pruritic rash to bilateral upper extremities and torso that started yesterday. Pt has chronic eczema, and went to dermatologist yesterday who prescribed him a shampoo to apply to skin. Pt applied shampoo which burned and has had no relief of symptoms since. He has received steroid injections in the past to clear previous flare ups. No fever. No N/V. No other symptoms noted.   The history is provided by the patient. No language interpreter was used.    Past Medical History:  Diagnosis Date  . Eczema     There are no active problems to display for this patient.   Past Surgical History:  Procedure Laterality Date  . MIDDLE EAR SURGERY Bilateral        Home Medications    Prior to Admission medications   Not on File    Family History History reviewed. No pertinent family history.  Social History Social History  Substance Use Topics  . Smoking status: Never Smoker  . Smokeless tobacco: Not on file  . Alcohol use No     Allergies   Patient has no known allergies.   Review of Systems Review of Systems  Constitutional: Negative for fever.  Skin: Positive for rash (diffuse to upper extremities and torso).  All other systems reviewed and are negative.  Physical Exam Updated Vital Signs BP 148/99 (BP Location: Right Arm)   Pulse 88   Temp 98 F (36.7 C) (Oral)   Resp 20   Ht 5' 11.5" (1.816 m)   Wt 210 lb (95.3 kg)   SpO2 99%   BMI  28.88 kg/m   Physical Exam  Constitutional: He is oriented to person, place, and time. Vital signs are normal. He appears well-developed and well-nourished. No distress.  HENT:  Head: Normocephalic and atraumatic.  Right Ear: Hearing normal.  Left Ear: Hearing normal.  Eyes: Conjunctivae and EOM are normal. Pupils are equal, round, and reactive to light.  Neck: Normal range of motion.  Cardiovascular: Normal rate, regular rhythm, normal heart sounds and intact distal pulses.   Pulmonary/Chest: Effort normal and breath sounds normal.  Neurological: He is alert and oriented to person, place, and time.  Skin: Skin is warm and dry. Rash noted. He is not diaphoretic.  Diffuse eczematous rash noted on upper extremities as well as torso. No signs of infectious. No wounds. No purulence.   Psychiatric: He has a normal mood and affect. His speech is normal and behavior is normal. Judgment and thought content normal.  Nursing note and vitals reviewed.  ED Treatments / Results  DIAGNOSTIC STUDIES: Oxygen Saturation is 99% on RA, normal by my interpretation.  COORDINATION OF CARE: 7:03 PM-Will order steroid injection. Discussed treatment plan with pt at bedside and pt agreed to plan.   Labs (all labs ordered are listed, but only abnormal results are displayed) Labs Reviewed - No data to display  EKG  EKG Interpretation None       Radiology No results  found.  Procedures Procedures (including critical care time)  Medications Ordered in ED Medications - No data to display   Initial Impression / Assessment and Plan / ED Course  I have reviewed the triage vital signs and the nursing notes.  Pertinent labs & imaging results that were available during my care of the patient were reviewed by me and considered in my medical decision making (see chart for details).  Clinical Course    Final Clinical Impressions(s) / ED Diagnoses  I have reviewed the relevant previous healthcare  records. I obtained HPI from historian.  ED Course:  Assessment: Pt is a 55yM with hx Eczema who presents with rash on BUE and torso. Triggered by stress. Hx same. On exam, pt in NAD. Nontoxic/nonseptic appearing. VSS. Afebrile. Lungs CTA. Heart RRR. Eczematous rash noted. No signs of infection. Given steroid injection in ED. Plan is to DC home with follow up to Dermatologist on Monday. At time of discharge, Patient is in no acute distress. Vital Signs are stable. Patient is able to ambulate. Patient able to tolerate PO.   Disposition/Plan:  DC Home Additional Verbal discharge instructions given and discussed with patient.  Pt Instructed to f/u with PCP in the next week for evaluation and treatment of symptoms. Return precautions given Pt acknowledges and agrees with plan  Supervising Physician Derwood KaplanAnkit Nanavati, MD   Final diagnoses:  Eczema, unspecified type    New Prescriptions New Prescriptions   No medications on file   I personally performed the services described in this documentation, which was scribed in my presence. The recorded information has been reviewed and is accurate.     Audry Piliyler Romey Cohea, PA-C 04/14/16 1911    Derwood KaplanAnkit Nanavati, MD 04/15/16 1850

## 2017-07-01 ENCOUNTER — Ambulatory Visit (INDEPENDENT_AMBULATORY_CARE_PROVIDER_SITE_OTHER): Payer: BLUE CROSS/BLUE SHIELD | Admitting: Otolaryngology

## 2017-07-01 DIAGNOSIS — H906 Mixed conductive and sensorineural hearing loss, bilateral: Secondary | ICD-10-CM

## 2017-07-01 DIAGNOSIS — H6983 Other specified disorders of Eustachian tube, bilateral: Secondary | ICD-10-CM

## 2017-07-01 DIAGNOSIS — H7012 Chronic mastoiditis, left ear: Secondary | ICD-10-CM | POA: Diagnosis not present

## 2017-07-26 ENCOUNTER — Encounter (HOSPITAL_COMMUNITY): Payer: Self-pay | Admitting: Emergency Medicine

## 2017-07-26 ENCOUNTER — Other Ambulatory Visit: Payer: Self-pay

## 2017-07-26 ENCOUNTER — Emergency Department (HOSPITAL_COMMUNITY)
Admission: EM | Admit: 2017-07-26 | Discharge: 2017-07-26 | Disposition: A | Discharge status: Home / Self Care | Payer: BLUE CROSS/BLUE SHIELD | Attending: Emergency Medicine | Admitting: Emergency Medicine

## 2017-07-26 DIAGNOSIS — K047 Periapical abscess without sinus: Secondary | ICD-10-CM

## 2017-07-26 DIAGNOSIS — L0291 Cutaneous abscess, unspecified: Secondary | ICD-10-CM

## 2017-07-26 DIAGNOSIS — K0889 Other specified disorders of teeth and supporting structures: Secondary | ICD-10-CM | POA: Diagnosis present

## 2017-07-26 DIAGNOSIS — K122 Cellulitis and abscess of mouth: Secondary | ICD-10-CM | POA: Diagnosis not present

## 2017-07-26 MED ORDER — HYDROCODONE-ACETAMINOPHEN 5-325 MG PO TABS
1.0000 | ORAL_TABLET | Freq: Once | ORAL | Status: AC
  Administered 2017-07-26: 1 via ORAL
  Filled 2017-07-26: qty 1

## 2017-07-26 MED ORDER — CLINDAMYCIN HCL 300 MG PO CAPS: 300.0000 mg | ORAL_CAPSULE | Freq: Three times a day (TID) | ORAL | 0 refills | Status: DC

## 2017-07-26 MED ORDER — NAPROXEN 500 MG PO TABS: 500.0000 mg | ORAL_TABLET | Freq: Two times a day (BID) | ORAL | 0 refills | Status: DC

## 2017-07-26 MED ORDER — CLINDAMYCIN HCL 150 MG PO CAPS
300.0000 mg | ORAL_CAPSULE | Freq: Once | ORAL | Status: AC
  Administered 2017-07-26: 300 mg via ORAL
  Filled 2017-07-26: qty 2

## 2017-07-26 NOTE — Discharge Instructions (Signed)
Take the medication as directed, follow up with your dentist as scheduled. Return here as needed. Do not take ibuprofen in addition to the medication you are prescribed.

## 2017-07-26 NOTE — ED Notes (Signed)
Not in WR when called 

## 2017-07-26 NOTE — ED Triage Notes (Signed)
Patient with abscess area to the palate, onset 1 week ago. Patient states the area has been becoming progressively worse.

## 2017-07-26 NOTE — ED Provider Notes (Signed)
[facility] EMERGENCY DEPARTMENT Provider Note   CSN: [id number] Arrival date & time: 07/26/17  2116     History   Chief Complaint Chief Complaint  Patient presents with  . Abscess    HPI Russell Warren is a 57 y.o. male who presents to the ED with abscess to the roof of her mouth.  HPI  Past Medical History:  Diagnosis Date  . Eczema     There are no active problems to display for this patient.   Past Surgical History:  Procedure Laterality Date  . MIDDLE EAR SURGERY Bilateral        Home Medications    Prior to Admission medications   Medication Sig Start Date End Date Taking? Authorizing Provider  clindamycin (CLEOCIN) 300 MG capsule Take 1 capsule (300 mg total) by mouth 3 (three) times daily. 07/26/17   Janne NapoleonNeese, Hope M, NP  naproxen (NAPROSYN) 500 MG tablet Take 1 tablet (500 mg total) by mouth 2 (two) times daily. 07/26/17   Janne NapoleonNeese, Hope M, NP    Family History Family History  Problem Relation Age of Onset  . Heart disease Father   . Diabetes Brother     Social History Social History   Tobacco Use  . Smoking status: Never Smoker  . Smokeless tobacco: Never Used  Substance Use Topics  . Alcohol use: No  . Drug use: Yes    Frequency: 4.0 times per week    Types: Marijuana    Comment: this afternoon     Allergies   Latex   Review of Systems Review of Systems  HENT: Positive for dental problem.   Skin: Positive for wound.       Abscess to roof of mouth     Physical Exam Updated Vital Signs BP (!) 143/93 (BP Location: Right Arm)   Pulse 84   Temp 98.3 F (36.8 C)   Resp 15   Ht 5' 11.5" (1.816 m)   Wt 99.3 kg (219 lb)   SpO2 95%   BMI 30.12 kg/m   Physical Exam  Constitutional: He appears well-developed and well-nourished. No distress.  HENT:  Head: Normocephalic and atraumatic.  Mouth/Throat: Uvula is midline.    Multiple dental caries  Eyes: EOM are normal.  Neck: Neck supple.  Cardiovascular: Normal rate.    Pulmonary/Chest: Effort normal.  Musculoskeletal: Normal range of motion.  Neurological: He is alert.  Skin: Skin is warm and dry.  Psychiatric: He has a normal mood and affect.  Nursing note and vitals reviewed.    ED Treatments / Results  Labs (all labs ordered are listed, but only abnormal results are displayed) Labs Reviewed - No data to display  EKG  EKG Interpretation None       Radiology No results found.  Procedures .Marland Kitchen.Incision and Drainage Date/Time: 07/26/2017 10:29 PM Performed by: Janne NapoleonNeese, Hope M, NP Authorized by: Janne NapoleonNeese, Hope M, NP   Consent:    Consent obtained:  Verbal   Consent given by:  Patient   Risks discussed:  Bleeding, incomplete drainage and pain   Alternatives discussed:  No treatment and alternative treatment Location:    Type:  Abscess   Location:  Mouth   Mouth location:  Palate Anesthesia (see MAR for exact dosages):    Anesthesia method:  None Procedure type:    Complexity:  Simple Procedure details:    Needle aspiration: yes     Needle size:  18 G   Incision types:  Stab incision  Drainage:  Purulent   Drainage amount:  Moderate   Wound treatment:  Wound left open Post-procedure details:    Patient tolerance of procedure:  Tolerated well, no immediate complications   (including critical care time)  Medications Ordered in ED Medications  HYDROcodone-acetaminophen (NORCO/VICODIN) 5-325 MG per tablet 1 tablet (not administered)  clindamycin (CLEOCIN) capsule 300 mg (not administered)     Initial Impression / Assessment and Plan / ED Course  I have reviewed the triage vital signs and the nursing notes. 57 y.o. male with dental pain and abscess to the palate stable for d/c without fever and does not appear toxic. Will treat with antibiotics and NSAIDS. Patient will keep his appointment with the oral surgeon next week.   Final Clinical Impressions(s) / ED Diagnoses   Final diagnoses:  Abscess  Dental infection    ED  Discharge Orders        Ordered    clindamycin (CLEOCIN) 300 MG capsule  3 times daily     07/26/17 2227    naproxen (NAPROSYN) 500 MG tablet  2 times daily     07/26/17 2227       Kerrie [place], [place] 07/26/17 2234    Marily Memos, MD 07/27/17 0002

## 2017-08-12 DIAGNOSIS — L308 Other specified dermatitis: Secondary | ICD-10-CM | POA: Diagnosis not present

## 2017-08-12 DIAGNOSIS — L218 Other seborrheic dermatitis: Secondary | ICD-10-CM | POA: Diagnosis not present

## 2017-10-31 ENCOUNTER — Ambulatory Visit (INDEPENDENT_AMBULATORY_CARE_PROVIDER_SITE_OTHER): Payer: BLUE CROSS/BLUE SHIELD | Admitting: Otolaryngology

## 2017-12-02 ENCOUNTER — Ambulatory Visit (INDEPENDENT_AMBULATORY_CARE_PROVIDER_SITE_OTHER): Payer: BLUE CROSS/BLUE SHIELD | Admitting: Otolaryngology

## 2017-12-02 DIAGNOSIS — H7012 Chronic mastoiditis, left ear: Secondary | ICD-10-CM | POA: Diagnosis not present

## 2018-02-19 DIAGNOSIS — L309 Dermatitis, unspecified: Secondary | ICD-10-CM | POA: Diagnosis not present

## 2018-02-19 DIAGNOSIS — Z6830 Body mass index (BMI) 30.0-30.9, adult: Secondary | ICD-10-CM | POA: Diagnosis not present

## 2018-02-19 DIAGNOSIS — M79671 Pain in right foot: Secondary | ICD-10-CM | POA: Diagnosis not present

## 2018-02-24 DIAGNOSIS — L03115 Cellulitis of right lower limb: Secondary | ICD-10-CM | POA: Diagnosis not present

## 2018-02-24 DIAGNOSIS — Z6830 Body mass index (BMI) 30.0-30.9, adult: Secondary | ICD-10-CM | POA: Diagnosis not present

## 2018-02-24 DIAGNOSIS — L309 Dermatitis, unspecified: Secondary | ICD-10-CM | POA: Diagnosis not present

## 2018-02-24 DIAGNOSIS — M79671 Pain in right foot: Secondary | ICD-10-CM | POA: Diagnosis not present

## 2018-02-24 DIAGNOSIS — R030 Elevated blood-pressure reading, without diagnosis of hypertension: Secondary | ICD-10-CM | POA: Diagnosis not present

## 2018-02-25 ENCOUNTER — Emergency Department (HOSPITAL_COMMUNITY): Payer: BLUE CROSS/BLUE SHIELD

## 2018-02-25 ENCOUNTER — Observation Stay (HOSPITAL_COMMUNITY)
Admission: EM | Admit: 2018-02-25 | Discharge: 2018-02-28 | Disposition: A | Discharge status: Home / Self Care | Payer: BLUE CROSS/BLUE SHIELD | Attending: Internal Medicine | Admitting: Internal Medicine

## 2018-02-25 ENCOUNTER — Encounter (HOSPITAL_COMMUNITY): Payer: Self-pay | Admitting: Emergency Medicine

## 2018-02-25 DIAGNOSIS — M19071 Primary osteoarthritis, right ankle and foot: Secondary | ICD-10-CM | POA: Insufficient documentation

## 2018-02-25 DIAGNOSIS — Z79899 Other long term (current) drug therapy: Secondary | ICD-10-CM | POA: Diagnosis not present

## 2018-02-25 DIAGNOSIS — M868X7 Other osteomyelitis, ankle and foot: Principal | ICD-10-CM | POA: Insufficient documentation

## 2018-02-25 DIAGNOSIS — Z8249 Family history of ischemic heart disease and other diseases of the circulatory system: Secondary | ICD-10-CM | POA: Diagnosis not present

## 2018-02-25 DIAGNOSIS — Z23 Encounter for immunization: Secondary | ICD-10-CM | POA: Diagnosis not present

## 2018-02-25 DIAGNOSIS — M79671 Pain in right foot: Secondary | ICD-10-CM | POA: Diagnosis not present

## 2018-02-25 DIAGNOSIS — Z91040 Latex allergy status: Secondary | ICD-10-CM | POA: Diagnosis not present

## 2018-02-25 DIAGNOSIS — M7989 Other specified soft tissue disorders: Secondary | ICD-10-CM | POA: Diagnosis not present

## 2018-02-25 DIAGNOSIS — R600 Localized edema: Secondary | ICD-10-CM | POA: Diagnosis not present

## 2018-02-25 DIAGNOSIS — M869 Osteomyelitis, unspecified: Secondary | ICD-10-CM | POA: Diagnosis not present

## 2018-02-25 HISTORY — DX: Personal history of other infectious and parasitic diseases: Z86.19

## 2018-02-25 HISTORY — DX: Meningitis, unspecified: G03.9

## 2018-02-25 HISTORY — DX: Unspecified hearing loss, unspecified ear: H91.90

## 2018-02-25 LAB — CBC WITH DIFFERENTIAL/PLATELET
Abs Immature Granulocytes: 0.1 10*3/uL (ref 0.0–0.1)
BASOS ABS: 0 10*3/uL (ref 0.0–0.1)
Basophils Relative: 0 %
Eosinophils Absolute: 0.1 10*3/uL (ref 0.0–0.7)
Eosinophils Relative: 1 %
HCT: 48.3 % (ref 39.0–52.0)
Hemoglobin: 15.8 g/dL (ref 13.0–17.0)
Immature Granulocytes: 1 %
Lymphocytes Relative: 15 %
Lymphs Abs: 1.6 10*3/uL (ref 0.7–4.0)
MCH: 30.6 pg (ref 26.0–34.0)
MCHC: 32.7 g/dL (ref 30.0–36.0)
MCV: 93.6 fL (ref 78.0–100.0)
MONO ABS: 0.9 10*3/uL (ref 0.1–1.0)
MONOS PCT: 8 %
NEUTROS PCT: 75 %
Neutro Abs: 8.1 10*3/uL — ABNORMAL HIGH (ref 1.7–7.7)
PLATELETS: 215 10*3/uL (ref 150–400)
RBC: 5.16 MIL/uL (ref 4.22–5.81)
RDW: 12.5 % (ref 11.5–15.5)
WBC: 10.8 10*3/uL — ABNORMAL HIGH (ref 4.0–10.5)

## 2018-02-25 LAB — C-REACTIVE PROTEIN: CRP: 7.6 mg/dL — AB (ref <1.0)

## 2018-02-25 LAB — BASIC METABOLIC PANEL
Anion gap: 11 (ref 5–15)
BUN: 10 mg/dL (ref 6–20)
CALCIUM: 9.5 mg/dL (ref 8.9–10.3)
CO2: 27 mmol/L (ref 22–32)
CREATININE: 0.85 mg/dL (ref 0.61–1.24)
Chloride: 101 mmol/L (ref 98–111)
GFR calc Af Amer: >60 mL/min (ref >60)
GFR calc non Af Amer: >60 mL/min (ref >60)
Glucose, Bld: 99 mg/dL (ref 70–99)
Potassium: 4 mmol/L (ref 3.5–5.1)
SODIUM: 139 mmol/L (ref 135–145)

## 2018-02-25 LAB — SEDIMENTATION RATE: Sed Rate: 14 mm/hr (ref 0–16)

## 2018-02-25 MED ORDER — OXYCODONE-ACETAMINOPHEN 5-325 MG PO TABS
1.0000 | ORAL_TABLET | Freq: Once | ORAL | Status: AC
  Administered 2018-02-25: 1 via ORAL
  Filled 2018-02-25: qty 1

## 2018-02-25 MED ORDER — VANCOMYCIN HCL IN DEXTROSE 1-5 GM/200ML-% IV SOLN
1000.0000 mg | Freq: Once | INTRAVENOUS | Status: AC
  Administered 2018-02-25: 1000 mg via INTRAVENOUS
  Filled 2018-02-25: qty 200

## 2018-02-25 MED ORDER — OXYCODONE-ACETAMINOPHEN 5-325 MG PO TABS
1.0000 | ORAL_TABLET | ORAL | Status: AC | PRN
  Administered 2018-02-25 – 2018-02-26 (×2): 1 via ORAL
  Filled 2018-02-25 (×2): qty 1

## 2018-02-25 MED ORDER — SODIUM CHLORIDE 0.9 % IV SOLN
INTRAVENOUS | Status: DC
  Administered 2018-02-25: via INTRAVENOUS

## 2018-02-25 NOTE — ED Notes (Signed)
This RN called MRI to get an estimated time for when pt was coming over, Mri could not give me a time

## 2018-02-25 NOTE — ED Provider Notes (Signed)
Patient placed in Quick Look pathway, seen and evaluated   Chief Complaint: Right foot swelling.  HPI: Patient reports worsening right foot pain, swelling, erythema and warmth over the past week.  Has been seen at urgent care twice for this with negative x-rays.  He started an antibiotic yesterday, he is unsure what the name of it was, he has taken 3 pills so far.  He denies any improvement.  Denies fevers, chills.  No history of gout.  ROS: No numbness or weakness  Physical Exam:   Gen: No distress  Neuro: Awake and Alert  Skin: Warm    Focused Exam: Right dorsal foot swollen, erythematous and warm to the touch.  No break in skin or wound.  Sensation to light touch intact.   Initiation of care has begun. The patient has been counseled on the process, plan, and necessity for staying for the completion/evaluation, and the remainder of the medical screening examination    Lawrence MarseillesShrosbree, Cosby Proby J, PA-C 02/25/18 1342    Melene PlanFloyd, Dan, DO 02/25/18 2231

## 2018-02-25 NOTE — ED Triage Notes (Signed)
Pt states over 1 week ago he started having pain while walking on his right foot and has increasing swelling in his foot since. Pt had an xray 1 week ago and showed to fracture- pt returned to urgent care yesterday and had repeat xray- also showed to break- diagnosed with skin infection, pt started in anabiotics yesterday and placed in boot.

## 2018-02-25 NOTE — ED Notes (Signed)
Patient transported to MRI 

## 2018-02-25 NOTE — ED Provider Notes (Signed)
[facility] EMERGENCY DEPARTMENT Provider Note   CSN: [id number] Arrival date & time: 02/25/18  1252     History   Chief Complaint Chief Complaint  Patient presents with  . Foot Pain    HPI RAMEY SCHIFF is a 57 y.o. male who presents today for evaluation of pain in his right foot.  He has had pain and swelling to the area approximately 1 week.  He has had an x-ray 1 week ago which showed no fracture.  He has been seen at urgent care where repeat x-ray did not show any fracture, he was started on antibiotics and given a boot.  He is here with worsening symptoms.  No fevers.   HPI  Past Medical History:  Diagnosis Date  . Eczema     There are no active problems to display for this patient.   Past Surgical History:  Procedure Laterality Date  . MIDDLE EAR SURGERY Bilateral         Home Medications    Prior to Admission medications   Medication Sig Start Date End Date Taking? Authorizing Provider  cephALEXin (KEFLEX) 500 MG capsule Take 500 mg by mouth every 6 (six) hours. For 10 days 02/24/18 03/05/18 Yes [provider]  amoxicillin-clavulanate (AUGMENTIN) 875-125 MG tablet Take 1 tablet by mouth 2 (two) times daily. For 7 days 02/19/18 02/26/18  [provider]  cephALEXin (KEFLEX) 500 MG capsule Take 500 mg by mouth every 6 (six) hours. 02/24/18   [provider]  clindamycin (CLEOCIN) 300 MG capsule Take 1 capsule (300 mg total) by mouth 3 (three) times daily. Patient not taking: Reported on 02/25/2018 07/26/17   Ashley Murrain, NP  naproxen (NAPROSYN) 500 MG tablet Take 1 tablet (500 mg total) by mouth 2 (two) times daily. Patient not taking: Reported on 02/25/2018 07/26/17   Ashley Murrain, NP    Family History Family History  Problem Relation Age of Onset  . Heart disease Father   . Diabetes Brother     Social History Social History   Tobacco Use  . Smoking status: Never Smoker  . Smokeless tobacco: Never Used    Substance Use Topics  . Alcohol use: No  . Drug use: Yes    Frequency: 4.0 times per week    Types: Marijuana    Comment: this afternoon     Allergies   Latex   Review of Systems Review of Systems  Constitutional: Negative for chills and fever.  Musculoskeletal: Positive for gait problem and joint swelling.  Skin: Positive for color change.  Neurological: Negative for weakness.  All other systems reviewed and are negative.    Physical Exam Updated Vital Signs BP (!) 156/109   Pulse 66   Temp 98.5 F (36.9 C) (Oral)   Resp 16   SpO2 95%   Physical Exam  Constitutional: He is oriented to person, place, and time. He appears well-developed and well-nourished.  HENT:  Head: Normocephalic and atraumatic.  Eyes: Conjunctivae are normal.  Neck: Neck supple.  Cardiovascular: Normal rate, regular rhythm and intact distal pulses.  No murmur heard. Pulmonary/Chest: Effort normal and breath sounds normal. No respiratory distress.  Abdominal: Soft. There is no tenderness.  Musculoskeletal: He exhibits no edema.  Diffuse TTP over the dorsum of the right foot, base of 5th metatarsal on plantar surface.   Neurological: He is alert and oriented to person, place, and time.  Skin: Skin is warm and dry.  Skin over the  right dorsum of the foot is mildly erythematous.  No fluctuance or induration.    Psychiatric: He has a normal mood and affect.  Nursing note and vitals reviewed.    ED Treatments / Results  Labs (all labs ordered are listed, but only abnormal results are displayed) Labs Reviewed  CBC WITH DIFFERENTIAL/PLATELET - Abnormal; Notable for the following components:      Result Value   WBC 10.8 (*)    Neutro Abs 8.1 (*)    All other components within normal limits  BASIC METABOLIC PANEL  C-REACTIVE PROTEIN  SEDIMENTATION RATE    EKG None  Radiology Dg Foot Complete Right  Result Date: 02/25/2018 CLINICAL DATA:  Pain while walking.  Swelling. EXAM: RIGHT  FOOT COMPLETE - 3+ VIEW COMPARISON:  No prior. FINDINGS: Diffuse soft tissue swelling. Degenerative changes are noted diffusely with degenerative changes most prominent about the first MTP joint. A lytic lesion is noted the base of the right fifth metatarsal. This could represent an active lesion including infection or tumor. An enchondroma could present in this fashion. MRI of the right foot suggested for further evaluation. IMPRESSION: 1. Diffuse soft tissue swelling. Diffuse mild degenerative change. Degenerative changes most prominent at the first MTP joint. 2. Lytic lesion noted in the base of the right fifth metatarsal for which MRI of the right foot suggested for further evaluation Electronically Signed   By: Marcello Moores  Register   On: 02/25/2018 14:13    Procedures Procedures (including critical care time)  Medications Ordered in ED Medications  oxyCODONE-acetaminophen (PERCOCET/ROXICET) 5-325 MG per tablet 1 tablet (1 tablet Oral Given 02/25/18 1342)     Initial Impression / Assessment and Plan / ED Course  I have reviewed the triage vital signs and the nursing notes.  Pertinent labs & imaging results that were available during my care of the patient were reviewed by me and considered in my medical decision making (see chart for details).  Clinical Course as of Feb 26 57  Tue Feb 25, 2018  2326 Doxy cipro   [EH]    Clinical Course User Index [EH] Lorin Glass, PA-C   Alfonso Ramus presents today for evaluation of continued pain in his right foot with associated redness.  He did not have any specific injury prior to the onset of his symptoms.  He is experiencing has had multiple x-rays without evidence of fracture.  X-ray today showed concern for bony process.  Versus osteomyelitis.  He does appear to have evidence of a superficial skin infection, therefore an MRI foot was ordered left knee which per recommendation of radiology.  ESR is not elevated, CRP is mildly elevated  at 7.6.  BMP does not show any significant electrolyte abnormalities.  White count is very mildly elevated at 10.8.   He was given a dose of vancomycin while in the emergency room.  His pain was treated with p.o. Percocet.  At shift change care was transferred to [facility] who will follow pending studies, re-evaulate and determine disposition.      Final Clinical Impressions(s) / ED Diagnoses   Final diagnoses:  Foot pain, right    ED Discharge Orders    None       Ollen Gross 02/27/18 0009    Lacretia Leigh, MD 02/27/18 [phone]

## 2018-02-25 NOTE — ED Notes (Signed)
ED Provider at bedside. 

## 2018-02-25 NOTE — ED Provider Notes (Signed)
Medical screening examination/treatment/procedure(s) were conducted as a shared visit with non-physician practitioner(s) and myself.  I personally evaluated the patient during the encounter.  None 57 year old male presents with worsening right foot swelling for over a week.  Has been seen in urgent care and was ruled out for gout and was treated for cellulitis.  States that the redness top of his foot has been increasing.  Denies any fever or chills.  X-ray of his foot shows possible lytic lesion in patient is waiting for an MRI at this time.  Will start patient on vancomycin and the disposition is pending   Lorre NickAllen, Lesleyanne Politte, MD 02/25/18 2311

## 2018-02-26 ENCOUNTER — Encounter (HOSPITAL_COMMUNITY): Payer: Self-pay | Admitting: Internal Medicine

## 2018-02-26 ENCOUNTER — Emergency Department (HOSPITAL_COMMUNITY): Payer: BLUE CROSS/BLUE SHIELD

## 2018-02-26 DIAGNOSIS — L729 Follicular cyst of the skin and subcutaneous tissue, unspecified: Secondary | ICD-10-CM | POA: Diagnosis not present

## 2018-02-26 DIAGNOSIS — L309 Dermatitis, unspecified: Secondary | ICD-10-CM

## 2018-02-26 DIAGNOSIS — Z91040 Latex allergy status: Secondary | ICD-10-CM | POA: Diagnosis not present

## 2018-02-26 DIAGNOSIS — M79671 Pain in right foot: Secondary | ICD-10-CM | POA: Diagnosis not present

## 2018-02-26 DIAGNOSIS — M869 Osteomyelitis, unspecified: Secondary | ICD-10-CM | POA: Diagnosis present

## 2018-02-26 DIAGNOSIS — R600 Localized edema: Secondary | ICD-10-CM | POA: Diagnosis not present

## 2018-02-26 LAB — BASIC METABOLIC PANEL
ANION GAP: 11 (ref 5–15)
BUN: 12 mg/dL (ref 6–20)
CALCIUM: 9.2 mg/dL (ref 8.9–10.3)
CHLORIDE: 103 mmol/L (ref 98–111)
CO2: 25 mmol/L (ref 22–32)
Creatinine, Ser: 0.8 mg/dL (ref 0.61–1.24)
GFR calc Af Amer: >60 mL/min (ref >60)
GFR calc non Af Amer: >60 mL/min (ref >60)
GLUCOSE: 108 mg/dL — AB (ref 70–99)
Potassium: 3.8 mmol/L (ref 3.5–5.1)
Sodium: 139 mmol/L (ref 135–145)

## 2018-02-26 LAB — CBC
HCT: 45.7 % (ref 39.0–52.0)
HEMOGLOBIN: 15.3 g/dL (ref 13.0–17.0)
MCH: 30.4 pg (ref 26.0–34.0)
MCHC: 33.5 g/dL (ref 30.0–36.0)
MCV: 90.7 fL (ref 78.0–100.0)
Platelets: 219 10*3/uL (ref 150–400)
RBC: 5.04 MIL/uL (ref 4.22–5.81)
RDW: 12.6 % (ref 11.5–15.5)
WBC: 7.9 10*3/uL (ref 4.0–10.5)

## 2018-02-26 LAB — HIV ANTIBODY (ROUTINE TESTING W REFLEX): HIV Screen 4th Generation wRfx: NONREACTIVE

## 2018-02-26 MED ORDER — CIPROFLOXACIN HCL 500 MG PO TABS: 500.0000 mg | ORAL_TABLET | Freq: Two times a day (BID) | ORAL | 0 refills | Status: DC

## 2018-02-26 MED ORDER — VANCOMYCIN HCL 10 G IV SOLR
1250.0000 mg | Freq: Two times a day (BID) | INTRAVENOUS | Status: DC
  Administered 2018-02-26 (×2): 1250 mg via INTRAVENOUS
  Filled 2018-02-26 (×3): qty 1250

## 2018-02-26 MED ORDER — ONDANSETRON HCL 4 MG/2ML IJ SOLN: 4.0000 mg | Freq: Four times a day (QID) | INTRAMUSCULAR | Status: DC | PRN

## 2018-02-26 MED ORDER — DIPHENHYDRAMINE HCL 25 MG PO CAPS
50.0000 mg | ORAL_CAPSULE | Freq: Once | ORAL | Status: AC
  Administered 2018-02-26: 50 mg via ORAL
  Filled 2018-02-26: qty 2

## 2018-02-26 MED ORDER — SODIUM CHLORIDE 0.9 % IV SOLN
2.0000 g | INTRAVENOUS | Status: DC
  Administered 2018-02-27 – 2018-02-28 (×2): 2 g via INTRAVENOUS
  Filled 2018-02-26 (×2): qty 20

## 2018-02-26 MED ORDER — DIPHENHYDRAMINE HCL 25 MG PO CAPS: 25.0000 mg | ORAL_CAPSULE | ORAL | Status: DC | PRN

## 2018-02-26 MED ORDER — SODIUM CHLORIDE 0.9 % IV SOLN
1.0000 g | Freq: Three times a day (TID) | INTRAVENOUS | Status: DC
  Filled 2018-02-26: qty 1

## 2018-02-26 MED ORDER — ONDANSETRON HCL 4 MG PO TABS: 4.0000 mg | ORAL_TABLET | Freq: Four times a day (QID) | ORAL | Status: DC | PRN

## 2018-02-26 MED ORDER — DOXYCYCLINE HYCLATE 100 MG PO CAPS: 100.0000 mg | ORAL_CAPSULE | Freq: Two times a day (BID) | ORAL | 0 refills | Status: DC

## 2018-02-26 MED ORDER — SODIUM CHLORIDE 0.9 % IV SOLN
2.0000 g | Freq: Once | INTRAVENOUS | Status: AC
  Administered 2018-02-26: 2 g via INTRAVENOUS
  Filled 2018-02-26: qty 2

## 2018-02-26 MED ORDER — ACETAMINOPHEN 325 MG PO TABS
650.0000 mg | ORAL_TABLET | Freq: Four times a day (QID) | ORAL | Status: DC | PRN
  Administered 2018-02-26 – 2018-02-28 (×4): 650 mg via ORAL
  Filled 2018-02-26 (×4): qty 2

## 2018-02-26 MED ORDER — ACETAMINOPHEN 650 MG RE SUPP: 650.0000 mg | Freq: Four times a day (QID) | RECTAL | Status: DC | PRN

## 2018-02-26 NOTE — ED Notes (Signed)
Attempted to call report to floor 

## 2018-02-26 NOTE — Progress Notes (Signed)
[facility]  [facility] Infusion Coordinator will follow pt with ID team to provide home infusion pharmacy services for home IV ABX at DC.  If patient discharges after hours, please call [phone].   Sedalia Muta 02/26/2018, 5:04 PM

## 2018-02-26 NOTE — H&P (Signed)
History and Physical    Russell Warren:[id number] DOB: 1960/10/03 DOA: 02/25/2018  PCP: Russell Warren, No Pcp Per  Russell Warren coming from: Home.  Chief Complaint: Right foot swelling and pain.  HPI: Russell Warren is a 57 y.o. male with no significant past medical history has been experiencing increasing pain and swelling in the right foot for the last 1 week.  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  He also goes for jogging every day for around 20 minutes.  Denies any fever chills.  Denies any insect bite or trauma.  Had gone to urgent care 3 days ago and was prescribed antibiotics which Russell Warren only took for 1 day.  Since the pain has been worsening Russell Warren came to the ER.  ED Course: In the ER lab work show mild leukocytosis.  CRP was elevated sed rate is 14.  MRI of the right foot done shows features concerning for possible osteomyelitis of the fifth metatarsal.  Russell Warren had blood cultures drawn antibiotic started for now.  Review of Systems: As per HPI, rest all negative.   Past Medical History:  Diagnosis Date  . Eczema     Past Surgical History:  Procedure Laterality Date  . MIDDLE EAR SURGERY Bilateral      reports that he has never smoked. He has never used smokeless tobacco. He reports that he has current or past drug history. Drug: Marijuana. Frequency: 4.00 times per week. He reports that he does not drink alcohol.  Allergies  Allergen Reactions  . Latex Rash    Family History  Problem Relation Age of Onset  . Heart disease Father   . Diabetes Brother     Prior to Admission medications   Medication Sig Start Date End Date Taking? Authorizing Provider  cephALEXin (KEFLEX) 500 MG capsule Take 500 mg by mouth every 6 (six) hours. For 10 days 02/24/18 03/05/18 Yes [provider]  amoxicillin-clavulanate (AUGMENTIN) 875-125 MG tablet Take 1 tablet by mouth 2 (two) times daily. For 7 days 02/19/18 02/26/18  [provider]  cephALEXin (KEFLEX) 500 MG capsule Take 500 mg by mouth every 6 (six) hours. 02/24/18   [provider]  ciprofloxacin (CIPRO) 500 MG tablet Take 1 tablet (500 mg total) by mouth 2 (two) times daily for 10 days. 02/26/18 03/08/18  Cristina Gong, PA-C  clindamycin (CLEOCIN) 300 MG capsule Take 1 capsule (300 mg total) by mouth 3 (three) times daily. Russell Warren not taking: Reported on 02/25/2018 07/26/17   Janne Napoleon, NP  doxycycline (VIBRAMYCIN) 100 MG capsule Take 1 capsule (100 mg total) by mouth 2 (two) times daily. 02/26/18   Cristina Gong, PA-C  naproxen (NAPROSYN) 500 MG tablet Take 1 tablet (500 mg total) by mouth 2 (two) times daily. Russell Warren not taking: Reported on 02/25/2018 07/26/17   Janne Napoleon, NP    Physical Exam: Vitals:   02/25/18 2100 02/25/18 2200 02/25/18 2300 02/26/18 0200  BP: (!) 141/95 (!) 133/91 (!) 133/93 109/77  Pulse: 68 66 (!) 56 (!) 56  Resp:  14 14 16   Temp:      TempSrc:      SpO2: 98% 97% 99% 95%      Constitutional: Moderately built and nourished. Vitals:   02/25/18 2100 02/25/18 2200 02/25/18 2300 02/26/18 0200  BP: (!) 141/95 (!) 133/91 (!) 133/93 109/77  Pulse: 68 66 (!) 56 (!) 56  Resp:  14 14 16  Temp:      TempSrc:      SpO2: 98% 97% 99% 95%   Eyes: Anicteric no pallor. ENMT: No discharge from the ears eyes nose or mouth. Neck: No mass felt.  No neck rigidity. Respiratory: No rhonchi or crepitations. Cardiovascular: S1-S2 heard no murmurs appreciated. Abdomen: Soft nontender bowel sounds present. Musculoskeletal: Swelling of the right foot with mild tenderness to the distal aspect of the foot in the midportion.  No acute ischemic changes seen. Skin: No rash. Neurologic: Alert awake oriented to time place and person.  Moves all extremities. Psychiatric: Appears normal per normal affect.   Labs on Admission: I have personally reviewed following labs and imaging studies  CBC: Recent Labs  Lab  02/25/18 1403  WBC 10.8*  NEUTROABS 8.1*  HGB 15.8  HCT 48.3  MCV 93.6  PLT 215   Basic Metabolic Panel: Recent Labs  Lab 02/25/18 1403  NA 139  K 4.0  CL 101  CO2 27  GLUCOSE 99  BUN 10  CREATININE 0.85  CALCIUM 9.5   GFR: CrCl cannot be calculated (Unknown ideal weight.). Liver Function Tests: No results for input(s): AST, ALT, ALKPHOS, BILITOT, PROT, ALBUMIN in the last 168 hours. No results for input(s): LIPASE, AMYLASE in the last 168 hours. No results for input(s): AMMONIA in the last 168 hours. Coagulation Profile: No results for input(s): INR, PROTIME in the last 168 hours. Cardiac Enzymes: No results for input(s): CKTOTAL, CKMB, CKMBINDEX, TROPONINI in the last 168 hours. BNP (last 3 results) No results for input(s): PROBNP in the last 8760 hours. HbA1C: No results for input(s): HGBA1C in the last 72 hours. CBG: No results for input(s): GLUCAP in the last 168 hours. Lipid Profile: No results for input(s): CHOL, HDL, LDLCALC, TRIG, CHOLHDL, LDLDIRECT in the last 72 hours. Thyroid Function Tests: No results for input(s): TSH, T4TOTAL, FREET4, T3FREE, THYROIDAB in the last 72 hours. Anemia Panel: No results for input(s): VITAMINB12, FOLATE, FERRITIN, TIBC, IRON, RETICCTPCT in the last 72 hours. Urine analysis: No results found for: COLORURINE, APPEARANCEUR, LABSPEC, PHURINE, GLUCOSEU, HGBUR, BILIRUBINUR, KETONESUR, PROTEINUR, UROBILINOGEN, NITRITE, LEUKOCYTESUR Sepsis Labs: @LABRCNTIP (procalcitonin:4,lacticidven:4) )No results found for this or any previous visit (from the past 240 hour(s)).   Radiological Exams on Admission: Dg Foot Complete Right  Result Date: 02/25/2018 CLINICAL DATA:  Pain while walking.  Swelling. EXAM: RIGHT FOOT COMPLETE - 3+ VIEW COMPARISON:  No prior. FINDINGS: Diffuse soft tissue swelling. Degenerative changes are noted diffusely with degenerative changes most prominent about the first MTP joint. A lytic lesion is noted the base of  the right fifth metatarsal. This could represent an active lesion including infection or tumor. An enchondroma could present in this fashion. MRI of the right foot suggested for further evaluation. IMPRESSION: 1. Diffuse soft tissue swelling. Diffuse mild degenerative change. Degenerative changes most prominent at the first MTP joint. 2. Lytic lesion noted in the base of the right fifth metatarsal for which MRI of the right foot suggested for further evaluation Electronically Signed   By: Maisie Fus  Register   On: 02/25/2018 14:13     Assessment/Plan Principal Problem:   Osteomyelitis (HCC) Active Problems:   Foot pain, right   Osteomyelitis of right foot (HCC)    1. Right foot swelling and pain concerning for osteomyelitis of the fifth metatarsal or MRI reports -blood cultures were obtained and Russell Warren was started on empiric antibiotics.  Please consult orthopedic surgery in the morning. 2. History of marijuana abuse.   DVT prophylaxis: SCDs  for now in anticipation of possible procedure. Code Status: Full code. Family Communication: Discussed with Russell Warren. Disposition Plan: Home. Consults called: None. Admission status: Observation.   Eduard Clos MD [facility] Pager [phone].  If 7PM-7AM, please contact night-coverage [email or web address] Password [facility]  02/26/2018, 3:37 AM

## 2018-02-26 NOTE — Progress Notes (Signed)
[facility] PROGRESS NOTE    Progress Note  Russell Warren  UYQ:[id number] DOB: 14-Sep-1960 DOA: 02/25/2018 PCP: Patient, No Pcp Per     Brief Narrative:   Russell Warren is an 57 y.o. male with no past medical history has been experiencing increased pain and swelling of the right foot for 1 week MRI of the foot was done that showed right foot fifth metatarsal possible osteomyelitis cultures drawn  Assessment/Plan:   Osteomyelitis of right foot (HCC) MRI was done with results as below, blood cultures results are pending. He was started empirically on IV antibiotics. We will consult infectious disease.   DVT prophylaxis: lovenox Family Communication:none Disposition Plan/Barrier to D/C: home in 3 days Code Status:     Code Status Orders  (From admission, onward)         Start     Ordered   02/26/18 0337  Full code  Continuous     02/26/18 0337        Code Status History    This patient has a current code status but no historical code status.        IV Access:    Peripheral IV   Procedures and diagnostic studies:   Mr Foot Right Wo Contrast  Result Date: 02/26/2018 CLINICAL DATA:  57 year old male presents with worsening right foot swelling for over a week. Has been seen in urgent care and was ruled out for gout and was treated for cellulitis. States that the redness top of his foot has been increasing EXAM: MRI OF THE RIGHT FOREFOOT WITHOUT CONTRAST TECHNIQUE: Multiplanar, multisequence MR imaging of the right forefoot was performed. No intravenous contrast was administered. COMPARISON:  None. FINDINGS: Bones/Joint/Cartilage Marrow edema within the mid and proximal shaft of the fifth metatarsal with a cystic area in the proximal shaft. Cortical irregularity along the plantar aspect of the base of the fifth metatarsal and mild cortical thickening of the base and proximal shaft. Severe bone marrow edema in the proximal second metatarsal. Moderate  osteoarthritis of the second tarsometatarsal joint with subchondral marrow edema on either side of the joint. Bone marrow edema in the proximal half of the fourth metatarsal. Mild osteoarthritis of the first TMT joint. Mild osteoarthritis of the third TMT joint. Mild osteoarthritis of the first MTP joint. No fracture or dislocation. Normal alignment. No joint effusion. Ligaments Collateral ligaments are intact.  Lisfranc ligament is intact. Muscles and Tendons Flexor and peroneal tendons are grossly intact. 16 x 16 mm cystic area along the dorsal aspect of the second tarsometatarsal joint concerning for a ganglion cyst. Multiloculated cystic area along the dorsal aspect of the foot along the course of the extensor digitorum tendons at the level of the fourth and fifth TMT joints most concerning for a ganglion cyst measuring 8 x 8 x 40 mm. 6 x 6 mm cystic area along the musculotendinous junction of abductor hallucis muscle. Soft tissue Severe soft tissue edema along the dorsal aspect of the foot most severe laterally. No soft tissue mass. IMPRESSION: 1. Marrow edema within the mid and proximal shaft of the fifth metatarsal with a cystic area in the proximal shaft. Cortical irregularity along the plantar aspect of the base of the fifth metatarsal and mild cortical thickening of the base and proximal shaft. Differential considerations include healing fracture versus chronic osteomyelitis given the severe overlying soft tissue edema. 2. Abnormal marrow signal in the second and fourth metatarsal shafts which likely reflects stress reaction without a fracture 3. Two  dominant cystic areas along the dorsal aspect of foot with one located along the second tarsometatarsal joint and a second multiloculated cystic along the dorsal lateral aspect most consistent with ganglion cysts. 4. Moderate osteoarthritis of the second tarsometatarsal joint. Mild osteoarthritis of the first, third and fourth tarsometatarsal joints.  Electronically Signed   By: Elige Ko   On: 02/26/2018 08:02   Dg Foot Complete Right  Result Date: 02/25/2018 CLINICAL DATA:  Pain while walking.  Swelling. EXAM: RIGHT FOOT COMPLETE - 3+ VIEW COMPARISON:  No prior. FINDINGS: Diffuse soft tissue swelling. Degenerative changes are noted diffusely with degenerative changes most prominent about the first MTP joint. A lytic lesion is noted the base of the right fifth metatarsal. This could represent an active lesion including infection or tumor. An enchondroma could present in this fashion. MRI of the right foot suggested for further evaluation. IMPRESSION: 1. Diffuse soft tissue swelling. Diffuse mild degenerative change. Degenerative changes most prominent at the first MTP joint. 2. Lytic lesion noted in the base of the right fifth metatarsal for which MRI of the right foot suggested for further evaluation Electronically Signed   By: Maisie Fus  Register   On: 02/25/2018 14:13     Medical Consultants:    None.  Anti-Infectives:   IV vancomycin  Subjective:    Russell Warren   Objective:    Vitals:   02/26/18 0400 02/26/18 0445 02/26/18 0500 02/26/18 0622  BP: (!) 147/92  116/70 (!) 148/97  Pulse: 64 (!) 54 62 68  Resp: 14 13 15 16   Temp:    97.7 F (36.5 C)  TempSrc:    Oral  SpO2: 95% 94% 92% 97%    Intake/Output Summary (Last 24 hours) at 02/26/2018 0949 Last data filed at 02/26/2018 0931 Gross per 24 hour  Intake 505.72 ml  Output -  Net 505.72 ml   There were no vitals filed for this visit.  Exam: General exam: In no acute distress. Respiratory system: Good air movement and clear to auscultation. Cardiovascular system: S1 & S2 heard, RRR. No JVD. Gastrointestinal system: Abdomen is nondistended, soft and nontender.  Central nervous system: Alert and oriented. No focal neurological deficits. Extremities: No pedal edema. Skin: No rashes, lesions or ulcers Psychiatry: Judgement and insight appear normal. Mood &  affect appropriate.    Data Reviewed:    Labs: Basic Metabolic Panel: Recent Labs  Lab 02/25/18 1403 02/26/18 0624  NA 139 139  K 4.0 3.8  CL 101 103  CO2 27 25  GLUCOSE 99 108*  BUN 10 12  CREATININE 0.85 0.80  CALCIUM 9.5 9.2   GFR CrCl cannot be calculated (Unknown ideal weight.). Liver Function Tests: No results for input(s): AST, ALT, ALKPHOS, BILITOT, PROT, ALBUMIN in the last 168 hours. No results for input(s): LIPASE, AMYLASE in the last 168 hours. No results for input(s): AMMONIA in the last 168 hours. Coagulation profile No results for input(s): INR, PROTIME in the last 168 hours.  CBC: Recent Labs  Lab 02/25/18 1403 02/26/18 0624  WBC 10.8* 7.9  NEUTROABS 8.1*  --   HGB 15.8 15.3  HCT 48.3 45.7  MCV 93.6 90.7  PLT 215 219   Cardiac Enzymes: No results for input(s): CKTOTAL, CKMB, CKMBINDEX, TROPONINI in the last 168 hours. BNP (last 3 results) No results for input(s): PROBNP in the last 8760 hours. CBG: No results for input(s): GLUCAP in the last 168 hours. D-Dimer: No results for input(s): DDIMER in the last 72 hours.  Hgb A1c: No results for input(s): HGBA1C in the last 72 hours. Lipid Profile: No results for input(s): CHOL, HDL, LDLCALC, TRIG, CHOLHDL, LDLDIRECT in the last 72 hours. Thyroid function studies: No results for input(s): TSH, T4TOTAL, T3FREE, THYROIDAB in the last 72 hours.  Invalid input(s): FREET3 Anemia work up: No results for input(s): VITAMINB12, FOLATE, FERRITIN, TIBC, IRON, RETICCTPCT in the last 72 hours. Sepsis Labs: Recent Labs  Lab 02/25/18 1403 02/26/18 0624  WBC 10.8* 7.9   Microbiology No results found for this or any previous visit (from the past 240 hour(s)).   Medications:    Continuous Infusions: . sodium chloride 20 mL/hr at 02/26/18 0600  . ceFEPime (MAXIPIME) IV    . vancomycin        LOS: 0 days   Marinda Elk  [facility] Pager [phone]  *Please refer to [email or web address],  password [email or web address] to get updated schedule on who will round on this patient, as hospitalists switch teams weekly. If 7PM-7AM, please contact night-coverage at [email or web address], password [email or web address] for any overnight needs.  02/26/2018, 9:49 AM

## 2018-02-26 NOTE — Discharge Instructions (Addendum)

## 2018-02-26 NOTE — Plan of Care (Signed)

## 2018-02-26 NOTE — Consult Note (Signed)
[facility]    Date of Admission:  02/25/2018             Reason for Consult: Osteomyelitis   Referring Provider: Aileen Fass Primary Care Provider: Patient, No Pcp Per   Assessment/Plan:   Russell Warren is a 57 y/o male with no significant previous history admitted with approximately 1 week of right foot pain and edema without significant trauma or injury. There is concern for osteomyelitis on x-ray and MRI with lytic lesion of the 5th metatarsal. He does have eczema had been itching his skin with superficial wounds with last being about 1 month ago. He is a small bit improved since he has been in the hospital with his foot elevated. I am not fully convinced this is osteomyelitis as he has minimal risk factors at present.   1. Recommend orthopedic evaluation.  2. Continue to monitor cultures. There does not appear to be any fluid collections for biopsy or sample. Will continue vancomycin and ceftriaxone for now as he has minimal risk for Pseudomonas.   Principal Problem:   Osteomyelitis ([place]) Active Problems:   Foot pain, right   Osteomyelitis of right foot ([place])   HPI: Russell Warren is a 57 y.o. male with no significant previous medical history was admitted on 9/25 with increasing pain and swelling of the right foot that has been going on for about 1 week. He has been running daily for about 20 minutes and works as a Development worker, community. There has been no trauma or injury that he is aware of. X-ray imaging with diffuse soft tissue swelling and a lytic lesion at the base of the 5th metatarsal. MRI with marrow edema within the mid and proximal shaft of the fifth metatarsal concerning healing fracture versus chronic osteomyelitis. CRP was 7.6 and ESR of 14. Blood cultures were drawn and started on vancomycin and cefepime. He has been afebrile since admission.   Russell Warren had an acute onset of pain while walking about 1 week ago with no history of injury or trauma.  The pain significantly worsened over the course of the week when ambulating on it. He has had no fevers or chills since initial onset. Has eczema and describes scratching the top of his foot raw about 1 month ago, but this was superficial and healed without any complication. He does have previous history of injury to the left ankle. Has also had cysts in varying areas of his body.   Review of Systems: Review of Systems  Constitutional: Negative for chills and fever.  Respiratory: Negative for cough, shortness of breath and wheezing.   Cardiovascular: Negative for chest pain and leg swelling.  Gastrointestinal: Negative for abdominal pain, constipation, diarrhea, nausea and vomiting.  Musculoskeletal:       Positive for right foot pain  Skin: Negative for rash.     Past Medical History:  Diagnosis Date  . Eczema     Social History   Tobacco Use  . Smoking status: Never Smoker  . Smokeless tobacco: Never Used  Substance Use Topics  . Alcohol use: No  . Drug use: Yes    Frequency: 4.0 times per week    Types: Marijuana    Comment: this afternoon    Family History  Problem Relation Age of Onset  . Heart disease Father   . Diabetes Brother     Allergies  Allergen Reactions  . Latex Rash    OBJECTIVE: Blood pressure (!) 148/97, pulse  68, temperature 97.7 F (36.5 C), temperature source Oral, resp. rate 16, SpO2 97 %.  Physical Exam  Constitutional: He is oriented to person, place, and time. He appears well-developed and well-nourished. No distress.  Cardiovascular: Normal rate, regular rhythm, normal heart sounds and intact distal pulses. Exam reveals no gallop and no friction rub.  No murmur heard. Pulmonary/Chest: Effort normal and breath sounds normal.  Musculoskeletal:  Right foot with edema and without discoloration or deformity. Tenderness of the fifth metatarsal. Distal pulses and sensation are appropriate.   Neurological: He is alert and oriented to person,  place, and time.  Skin: Skin is warm and dry.  Psychiatric: He has a normal mood and affect. His behavior is normal. Judgment and thought content normal.    Lab Results Lab Results  Component Value Date   WBC 7.9 02/26/2018   HGB 15.3 02/26/2018   HCT 45.7 02/26/2018   MCV 90.7 02/26/2018   PLT 219 02/26/2018    Lab Results  Component Value Date   CREATININE 0.80 02/26/2018   BUN 12 02/26/2018   NA 139 02/26/2018   K 3.8 02/26/2018   CL 103 02/26/2018   CO2 25 02/26/2018   No results found for: ALT, AST, GGT, ALKPHOS, BILITOT   Microbiology: No results found for this or any previous visit (from the past 240 hour(s)).   Terri Piedra, NP [facility] [phone] Pager  02/26/2018  10:35 AM

## 2018-02-26 NOTE — Consult Note (Signed)
Reason for Consult:Right foot pain Referring Physician: Sargent Warren is an 57 y.o. male.  HPI: Russell Warren came to the hospital with about an 8d hx/o right foot pain, redness, and swelling. It started just as mild pain with ambulation confined to the lateral forefoot. He states he often has pain in that area. By the next day the pain had spread to involve the entire forefoot and back to the ankle. It was bad enough that he had to limp. He went to [facility] and was treated for a musculoskeletal problem. He continued to swell and his foot got red. He returned to [facility] and was given abx but only took one dose before his family convinced him to come to ED for evaluation. He was admitted by IM. X-rays showed a lytic lesion at base of 5th MT and recommended MRI. MRI was equivocal, favored osteo or healing fx/stress fx. Pt denies any prior hx/o similar problem, no wounds or injury, no fevers, chills, sweats, N/V. 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  Past Medical History:  Diagnosis Date  . Eczema     Past Surgical History:  Procedure Laterality Date  . DENTAL SURGERY    . MIDDLE EAR SURGERY Bilateral     Family History  Problem Relation Age of Onset  . Heart disease Father   . Diabetes Brother     Social History:  reports that he has never smoked. He has never used smokeless tobacco. He reports that he has current or past drug history. Drug: Marijuana. Frequency: 4.00 times per week. He reports that he does not drink alcohol.  Allergies:  Allergies  Allergen Reactions  . Latex Rash    Medications: I have reviewed the patient's current medications.  Results for orders placed or performed during the hospital encounter of 02/25/18 (from the past 48 hour(s))  CBC with Differential     Status: Abnormal   Collection Time: 02/25/18  2:03 PM  Result Value Ref Range   WBC 10.8 (H) 4.0 - 10.5 K/uL   RBC 5.16 4.22 - 5.81 MIL/uL   Hemoglobin 15.8 13.0 - 17.0 g/dL   HCT  48.3 39.0 - 52.0 %   MCV 93.6 78.0 - 100.0 fL   MCH 30.6 26.0 - 34.0 pg   MCHC 32.7 30.0 - 36.0 g/dL   RDW 12.5 11.5 - 15.5 %   Platelets 215 150 - 400 K/uL   Neutrophils Relative % 75 %   Neutro Abs 8.1 (H) 1.7 - 7.7 K/uL   Lymphocytes Relative 15 %   Lymphs Abs 1.6 0.7 - 4.0 K/uL   Monocytes Relative 8 %   Monocytes Absolute 0.9 0.1 - 1.0 K/uL   Eosinophils Relative 1 %   Eosinophils Absolute 0.1 0.0 - 0.7 K/uL   Basophils Relative 0 %   Basophils Absolute 0.0 0.0 - 0.1 K/uL   Immature Granulocytes 1 %   Abs Immature Granulocytes 0.1 0.0 - 0.1 K/uL    Comment: Performed at [facility], [address] [place], [place] [address]  Basic metabolic panel     Status: None   Collection Time: 02/25/18  2:03 PM  Result Value Ref Range   Sodium 139 135 - 145 mmol/L   Potassium 4.0 3.5 - 5.1 mmol/L   Chloride 101 98 - 111 mmol/L   CO2 27 22 - 32 mmol/L   Glucose, Bld 99 70 - 99 mg/dL   BUN 10 6 -  20 mg/dL   Creatinine, Ser 0.85 0.61 - 1.24 mg/dL   Calcium 9.5 8.9 - 10.3 mg/dL   GFR calc non Af Amer >60 >60 mL/min   GFR calc Af Amer >60 >60 mL/min    Comment: (NOTE) The eGFR has been calculated using the CKD EPI equation. This calculation has not been validated in all clinical situations. eGFR's persistently <60 mL/min signify possible Chronic Kidney Disease.    Anion gap 11 5 - 15    Comment: Performed at [facility] [address], [place], [place] [address]  C-reactive protein     Status: Abnormal   Collection Time: 02/25/18  8:42 PM  Result Value Ref Range   CRP 7.6 (H) <1.0 mg/dL    Comment: Performed at [facility] [address], [place], [place] [address]  Sedimentation rate     Status: None   Collection Time: 02/25/18  8:42 PM  Result Value Ref Range   Sed Rate 14 0 - 16 mm/hr    Comment: Performed at [facility] [address], [place], [place] [address]  Basic metabolic panel     Status: Abnormal   Collection Time: 02/26/18  6:24 AM   Result Value Ref Range   Sodium 139 135 - 145 mmol/L   Potassium 3.8 3.5 - 5.1 mmol/L   Chloride 103 98 - 111 mmol/L   CO2 25 22 - 32 mmol/L   Glucose, Bld 108 (H) 70 - 99 mg/dL   BUN 12 6 - 20 mg/dL   Creatinine, Ser 0.80 0.61 - 1.24 mg/dL   Calcium 9.2 8.9 - 10.3 mg/dL   GFR calc non Af Amer >60 >60 mL/min   GFR calc Af Amer >60 >60 mL/min    Comment: (NOTE) The eGFR has been calculated using the CKD EPI equation. This calculation has not been validated in all clinical situations. eGFR's persistently <60 mL/min signify possible Chronic Kidney Disease.    Anion gap 11 5 - 15    Comment: Performed at [facility] [address], [place] [address]  CBC     Status: None   Collection Time: 02/26/18  6:24 AM  Result Value Ref Range   WBC 7.9 4.0 - 10.5 K/uL   RBC 5.04 4.22 - 5.81 MIL/uL   Hemoglobin 15.3 13.0 - 17.0 g/dL   HCT 45.7 39.0 - 52.0 %   MCV 90.7 78.0 - 100.0 fL   MCH 30.4 26.0 - 34.0 pg   MCHC 33.5 30.0 - 36.0 g/dL   RDW 12.6 11.5 - 15.5 %   Platelets 219 150 - 400 K/uL    Comment: Performed at [facility], [place] [address] [place], [place] [address]    Mr Foot Right Wo Contrast  Result Date: 02/26/2018 CLINICAL DATA:  57 year old male presents with worsening right foot swelling for over a week. Has been seen in urgent care and was ruled out for gout and was treated for cellulitis. States that the redness top of his foot has been increasing EXAM: MRI OF THE RIGHT FOREFOOT WITHOUT CONTRAST TECHNIQUE: Multiplanar, multisequence MR imaging of the right forefoot was performed. No intravenous contrast was administered. COMPARISON:  None. FINDINGS: Bones/Joint/Cartilage Marrow edema within the mid and proximal shaft of the fifth metatarsal with a cystic area in the proximal shaft. Cortical irregularity along the plantar aspect of the base of the fifth metatarsal and mild cortical thickening of the base and proximal shaft. Severe  bone marrow edema in the  proximal second metatarsal. Moderate osteoarthritis of the second tarsometatarsal joint with subchondral marrow edema on either side of the joint. Bone marrow edema in the proximal half of the fourth metatarsal. Mild osteoarthritis of the first TMT joint. Mild osteoarthritis of the third TMT joint. Mild osteoarthritis of the first MTP joint. No fracture or dislocation. Normal alignment. No joint effusion. Ligaments Collateral ligaments are intact.  Lisfranc ligament is intact. Muscles and Tendons Flexor and peroneal tendons are grossly intact. 16 x 16 mm cystic area along the dorsal aspect of the second tarsometatarsal joint concerning for a ganglion cyst. Multiloculated cystic area along the dorsal aspect of the foot along the course of the extensor digitorum tendons at the level of the fourth and fifth TMT joints most concerning for a ganglion cyst measuring 8 x 8 x 40 mm. 6 x 6 mm cystic area along the musculotendinous junction of abductor hallucis muscle. Soft tissue Severe soft tissue edema along the dorsal aspect of the foot most severe laterally. No soft tissue mass. IMPRESSION: 1. Marrow edema within the mid and proximal shaft of the fifth metatarsal with a cystic area in the proximal shaft. Cortical irregularity along the plantar aspect of the base of the fifth metatarsal and mild cortical thickening of the base and proximal shaft. Differential considerations include healing fracture versus chronic osteomyelitis given the severe overlying soft tissue edema. 2. Abnormal marrow signal in the second and fourth metatarsal shafts which likely reflects stress reaction without a fracture 3. Two dominant cystic areas along the dorsal aspect of foot with one located along the second tarsometatarsal joint and a second multiloculated cystic along the dorsal lateral aspect most consistent with ganglion cysts. 4. Moderate osteoarthritis of the second tarsometatarsal joint. Mild osteoarthritis of the first, third and  fourth tarsometatarsal joints. Electronically Signed   By: Kathreen Devoid   On: 02/26/2018 08:02   Dg Foot Complete Right  Result Date: 02/25/2018 CLINICAL DATA:  Pain while walking.  Swelling. EXAM: RIGHT FOOT COMPLETE - 3+ VIEW COMPARISON:  No prior. FINDINGS: Diffuse soft tissue swelling. Degenerative changes are noted diffusely with degenerative changes most prominent about the first MTP joint. A lytic lesion is noted the base of the right fifth metatarsal. This could represent an active lesion including infection or tumor. An enchondroma could present in this fashion. MRI of the right foot suggested for further evaluation. IMPRESSION: 1. Diffuse soft tissue swelling. Diffuse mild degenerative change. Degenerative changes most prominent at the first MTP joint. 2. Lytic lesion noted in the base of the right fifth metatarsal for which MRI of the right foot suggested for further evaluation Electronically Signed   By: Marcello Moores  Register   On: 02/25/2018 14:13    Review of Systems  Constitutional: Negative for chills, fever and weight loss.  HENT: Negative for ear discharge, ear pain, hearing loss and tinnitus.   Eyes: Negative for blurred vision, double vision, photophobia and pain.  Respiratory: Negative for cough, sputum production and shortness of breath.   Cardiovascular: Negative for chest pain.  Gastrointestinal: Negative for abdominal pain, nausea and vomiting.  Genitourinary: Negative for dysuria, flank pain, frequency and urgency.  Musculoskeletal: Positive for joint pain (Right foot). Negative for back pain, falls, myalgias and neck pain.  Neurological: Negative for dizziness, tingling, sensory change, focal weakness, loss of consciousness and headaches.  Endo/Heme/Allergies: Does not bruise/bleed easily.  Psychiatric/Behavioral: Negative for depression, memory loss and substance abuse. The patient is not nervous/anxious.  Blood pressure (!) 148/97, pulse 68, temperature 97.7 F (36.5  C), temperature source Oral, resp. rate 16, SpO2 97 %. Physical Exam  Constitutional: He appears well-developed and well-nourished. No distress.  HENT:  Head: Normocephalic and atraumatic.  Eyes: Conjunctivae are normal. Right eye exhibits no discharge. Left eye exhibits no discharge. No scleral icterus.  Neck: Normal range of motion.  Cardiovascular: Normal rate and regular rhythm.  Respiratory: Effort normal. No respiratory distress.  Musculoskeletal:  RLE No traumatic wounds or ecchymosis. Eczematous rash noted BLE.  Foot TTP, esp laterally.  No knee or ankle effusion  Knee stable to varus/ valgus and anterior/posterior stress  Sens DPN, SPN, TN intact  Motor EHL, ext, flex, evers 5/5  DP 1+, PT 1+, 2-3+ NP edema  Neurological: He is alert.  Skin: Skin is warm and dry. He is not diaphoretic.  Psychiatric: He has a normal mood and affect. His behavior is normal.    Assessment/Plan: Right foot pain -- Given his exam I would favor infection over a musculoskeletal etiology but it's not clear. At this point would recommend treating for both infection (per ID) as well as fx in hard soled shoe (which he already has) and NWB RLE. He should f/u with Dr. Doran Durand in 1-2 weeks for reassessment.    Lisette Abu, PA-C Orthopedic Surgery [phone] 02/26/2018, 12:44 PM

## 2018-02-26 NOTE — ED Provider Notes (Addendum)
[id number] AM 57 year old male presents to the emergency department for evaluation of persistent right foot pain.  Care [facility] shift [facility].  Symptoms began 9 days ago.  No known inciting injury and patient is not a diabetic.  Was started on a course of outpatient antibiotics recently without relief.  Patient is unaware of what antibiotic he is supposed to be taking, but states that it was a "30-day supply" to be taken "4 times a day".  Patient is afebrile today.  Does have a slightly elevated white blood cell count with left shift.  CRP is also elevated at 7.6.  Initial x-ray with concern for lytic lesion at the base of the right fifth metatarsal.  MRI was ordered for further evaluation.  MR Impression: There is bone marrow edema involving the proximal and midportion of the fifth metatarsal] concerning for osteomyelitis. Clinical correlation is recommended.  There is diffuse soft tissue swelling predominantly involving the dorsum of the foot. There is a 16 x 16 mm fluid attenuating collection in the subcutaneous soft tissues of the dorsum of the foot at the first tarsometatarsal joint. This may represent an infected fluid, or a cyst.    Given concern on MRI for developing osteomyelitis, will admit for continued IV abx. Patient aware of need for admission and is agreeable to plan. Dr. Toniann FailKakrakandy to admit.  Vitals:   02/25/18 2100 02/25/18 2200 02/25/18 2300 02/26/18 0200  BP: (!) 141/95 (!) 133/91 (!) 133/93 109/77  Pulse: 68 66 (!) 56 (!) 56  Resp:  14 14 16   Temp:      TempSrc:      SpO2: 98% 97% 99% 95%         CRITICAL CARE Performed by: Antony MaduraKelly Yong Grieser   Total critical care time: 35 minutes  Critical care time was exclusive of separately billable procedures and treating other patients.  Critical care was necessary to treat or prevent imminent or life-threatening deterioration.  Critical care was time spent personally by me on the following activities:  development of treatment plan with patient and/or surrogate as well as nursing, discussions with consultants, evaluation of patient's response to treatment, examination of patient, obtaining history from patient or surrogate, ordering and performing treatments and interventions, ordering and review of laboratory studies, ordering and review of radiographic studies, pulse oximetry and re-evaluation of patient's condition.    Antony MaduraHumes, Angy Swearengin, PA-C 02/26/18 0336    Antony MaduraHumes, Alithea Lapage, PA-C 02/26/18 [id number]    Glynn Octaveancour, Stephen, MD 02/26/18 [phone]

## 2018-02-26 NOTE — Progress Notes (Signed)
Pharmacy Antibiotic Note  Loleta ChanceBartley A Stanard is a 57 y.o. male admitted on 02/25/2018 with right foot swelling/concern for osteomyelitis.  Pharmacy has been consulted for Vancomycin/Cefepime dosing. WBC 10.8. Renal function is good.   Plan: Vancomycin 1250 mg IV q12h Cefepime 1g IV q8h Trend WBC, temp, renal function  F/U infectious work-up Drug levels as indicated  Temp (24hrs), Avg:98.2 F (36.8 C), Min:97.7 F (36.5 C), Max:98.5 F (36.9 C)  Recent Labs  Lab 02/25/18 1403  WBC 10.8*  CREATININE 0.85    CrCl cannot be calculated (Unknown ideal weight.).    Allergies  Allergen Reactions  . Latex Rash    Abran DukeLedford, Rashard Ryle 02/26/2018 6:32 AM

## 2018-02-27 ENCOUNTER — Observation Stay: Payer: Self-pay

## 2018-02-27 ENCOUNTER — Encounter (HOSPITAL_COMMUNITY): Payer: Self-pay | Admitting: General Practice

## 2018-02-27 ENCOUNTER — Other Ambulatory Visit: Payer: Self-pay

## 2018-02-27 DIAGNOSIS — M86171 Other acute osteomyelitis, right ankle and foot: Secondary | ICD-10-CM | POA: Diagnosis not present

## 2018-02-27 DIAGNOSIS — M869 Osteomyelitis, unspecified: Secondary | ICD-10-CM | POA: Diagnosis not present

## 2018-02-27 LAB — VANCOMYCIN, TROUGH: Vancomycin Tr: 7 ug/mL — ABNORMAL LOW (ref 15–20)

## 2018-02-27 MED ORDER — VANCOMYCIN HCL 10 G IV SOLR: 1750.0000 mg | Freq: Two times a day (BID) | INTRAVENOUS | Status: DC

## 2018-02-27 MED ORDER — VANCOMYCIN HCL 10 G IV SOLR
1750.0000 mg | Freq: Two times a day (BID) | INTRAVENOUS | Status: DC
  Administered 2018-02-27: 1750 mg via INTRAVENOUS
  Filled 2018-02-27 (×2): qty 1750

## 2018-02-27 MED ORDER — VANCOMYCIN IV (FOR PTA / DISCHARGE USE ONLY): 1750.0000 mg | Freq: Two times a day (BID) | INTRAVENOUS | 0 refills | Status: DC

## 2018-02-27 MED ORDER — CEFTRIAXONE IV (FOR PTA / DISCHARGE USE ONLY): 2.0000 g | INTRAVENOUS | 0 refills | Status: DC

## 2018-02-27 MED ORDER — INFLUENZA VAC SPLIT QUAD 0.5 ML IM SUSY
0.5000 mL | PREFILLED_SYRINGE | INTRAMUSCULAR | Status: AC
  Administered 2018-02-27: 0.5 mL via INTRAMUSCULAR
  Filled 2018-02-27: qty 0.5

## 2018-02-27 MED ORDER — VANCOMYCIN HCL 10 G IV SOLR
1750.0000 mg | Freq: Two times a day (BID) | INTRAVENOUS | Status: DC
  Administered 2018-02-28: 1750 mg via INTRAVENOUS
  Filled 2018-02-27 (×2): qty 1750

## 2018-02-27 MED ORDER — SODIUM CHLORIDE 0.9 % IV SOLN: 2.0000 g | INTRAVENOUS | Status: DC

## 2018-02-27 MED ORDER — SODIUM CHLORIDE 0.9% FLUSH: 10.0000 mL | INTRAVENOUS | Status: DC | PRN

## 2018-02-27 NOTE — Progress Notes (Signed)
[facility] PROGRESS NOTE    Progress Note  [name]  ONG:[id number] DOB: 1960-09-30 DOA: 02/25/2018 PCP: Patient, No Pcp Per     Brief Narrative:   SENAI KINGSLEY is an 57 y.o. male with no past medical history has been experiencing increased pain and swelling of the right foot for 1 week MRI of the foot was done that showed right foot fifth metatarsal possible osteomyelitis cultures drawn  Assessment/Plan:   Osteomyelitis of right foot (HCC) MRI was done with results as below, blood cultures results are pending. Infectious disease was consulted who recommended IV Vanco and ceftriaxone for 6 weeks will place PICC line.   DVT prophylaxis: lovenox Family Communication:none Disposition Plan/Barrier to D/C: home in the morning. Code Status:     Code Status Orders  (From admission, onward)         Start     Ordered   02/26/18 0337  Full code  Continuous     02/26/18 0337        Code Status History    This patient has a current code status but no historical code status.        IV Access:    Peripheral IV   Procedures and diagnostic studies:   Mr Foot Right Wo Contrast  Result Date: 02/26/2018 CLINICAL DATA:  57 year old male presents with worsening right foot swelling for over a week. Has been seen in urgent care and was ruled out for gout and was treated for cellulitis. States that the redness top of his foot has been increasing EXAM: MRI OF THE RIGHT FOREFOOT WITHOUT CONTRAST TECHNIQUE: Multiplanar, multisequence MR imaging of the right forefoot was performed. No intravenous contrast was administered. COMPARISON:  None. FINDINGS: Bones/Joint/Cartilage Marrow edema within the mid and proximal shaft of the fifth metatarsal with a cystic area in the proximal shaft. Cortical irregularity along the plantar aspect of the base of the fifth metatarsal and mild cortical thickening of the base and proximal shaft. Severe bone marrow edema in the proximal  second metatarsal. Moderate osteoarthritis of the second tarsometatarsal joint with subchondral marrow edema on either side of the joint. Bone marrow edema in the proximal half of the fourth metatarsal. Mild osteoarthritis of the first TMT joint. Mild osteoarthritis of the third TMT joint. Mild osteoarthritis of the first MTP joint. No fracture or dislocation. Normal alignment. No joint effusion. Ligaments Collateral ligaments are intact.  Lisfranc ligament is intact. Muscles and Tendons Flexor and peroneal tendons are grossly intact. 16 x 16 mm cystic area along the dorsal aspect of the second tarsometatarsal joint concerning for a ganglion cyst. Multiloculated cystic area along the dorsal aspect of the foot along the course of the extensor digitorum tendons at the level of the fourth and fifth TMT joints most concerning for a ganglion cyst measuring 8 x 8 x 40 mm. 6 x 6 mm cystic area along the musculotendinous junction of abductor hallucis muscle. Soft tissue Severe soft tissue edema along the dorsal aspect of the foot most severe laterally. No soft tissue mass. IMPRESSION: 1. Marrow edema within the mid and proximal shaft of the fifth metatarsal with a cystic area in the proximal shaft. Cortical irregularity along the plantar aspect of the base of the fifth metatarsal and mild cortical thickening of the base and proximal shaft. Differential considerations include healing fracture versus chronic osteomyelitis given the severe overlying soft tissue edema. 2. Abnormal marrow signal in the second and fourth metatarsal shafts which likely reflects stress reaction  without a fracture 3. Two dominant cystic areas along the dorsal aspect of foot with one located along the second tarsometatarsal joint and a second multiloculated cystic along the dorsal lateral aspect most consistent with ganglion cysts. 4. Moderate osteoarthritis of the second tarsometatarsal joint. Mild osteoarthritis of the first, third and fourth  tarsometatarsal joints. Electronically Signed   By: Elige Ko   On: 02/26/2018 08:02   Dg Foot Complete Right  Result Date: 02/25/2018 CLINICAL DATA:  Pain while walking.  Swelling. EXAM: RIGHT FOOT COMPLETE - 3+ VIEW COMPARISON:  No prior. FINDINGS: Diffuse soft tissue swelling. Degenerative changes are noted diffusely with degenerative changes most prominent about the first MTP joint. A lytic lesion is noted the base of the right fifth metatarsal. This could represent an active lesion including infection or tumor. An enchondroma could present in this fashion. MRI of the right foot suggested for further evaluation. IMPRESSION: 1. Diffuse soft tissue swelling. Diffuse mild degenerative change. Degenerative changes most prominent at the first MTP joint. 2. Lytic lesion noted in the base of the right fifth metatarsal for which MRI of the right foot suggested for further evaluation Electronically Signed   By: Maisie Fus  Register   On: 02/25/2018 14:13     Medical Consultants:    None.  Anti-Infectives:   IV vancomycin  Subjective:    JAEDON SILER has no new complaints swelling is significantly improved. Objective:    Vitals:   02/26/18 1332 02/26/18 1500 02/26/18 2144 02/27/18 0412  BP: (!) 137/96 136/90 (!) 146/104 130/88  Pulse: 65 61 61 64  Resp: 16  19   Temp: 97.8 F (36.6 C)  98 F (36.7 C) 98.4 F (36.9 C)  TempSrc: Oral  Oral Oral  SpO2: 97%  99% 97%    Intake/Output Summary (Last 24 hours) at 02/27/2018 0930 Last data filed at 02/27/2018 0515 Gross per 24 hour  Intake 1305.05 ml  Output 1225 ml  Net 80.05 ml   There were no vitals filed for this visit.  Exam: General exam: In no acute distress. Respiratory system: Good air movement and clear to auscultation. Cardiovascular system: S1 & S2 heard, RRR. No JVD. Gastrointestinal system: Abdomen is nondistended, soft and nontender.  Central nervous system: Alert and oriented. No focal neurological  deficits. Extremities: No pedal edema. Skin: Erythema and swelling are not improved compared to yesterday. Psychiatry: Judgement and insight appear normal. Mood & affect appropriate.    Data Reviewed:    Labs: Basic Metabolic Panel: Recent Labs  Lab 02/25/18 1403 02/26/18 0624  NA 139 139  K 4.0 3.8  CL 101 103  CO2 27 25  GLUCOSE 99 108*  BUN 10 12  CREATININE 0.85 0.80  CALCIUM 9.5 9.2   GFR CrCl cannot be calculated (Unknown ideal weight.). Liver Function Tests: No results for input(s): AST, ALT, ALKPHOS, BILITOT, PROT, ALBUMIN in the last 168 hours. No results for input(s): LIPASE, AMYLASE in the last 168 hours. No results for input(s): AMMONIA in the last 168 hours. Coagulation profile No results for input(s): INR, PROTIME in the last 168 hours.  CBC: Recent Labs  Lab 02/25/18 1403 02/26/18 0624  WBC 10.8* 7.9  NEUTROABS 8.1*  --   HGB 15.8 15.3  HCT 48.3 45.7  MCV 93.6 90.7  PLT 215 219   Cardiac Enzymes: No results for input(s): CKTOTAL, CKMB, CKMBINDEX, TROPONINI in the last 168 hours. BNP (last 3 results) No results for input(s): PROBNP in the last 8760 hours. CBG:  No results for input(s): GLUCAP in the last 168 hours. D-Dimer: No results for input(s): DDIMER in the last 72 hours. Hgb A1c: No results for input(s): HGBA1C in the last 72 hours. Lipid Profile: No results for input(s): CHOL, HDL, LDLCALC, TRIG, CHOLHDL, LDLDIRECT in the last 72 hours. Thyroid function studies: No results for input(s): TSH, T4TOTAL, T3FREE, THYROIDAB in the last 72 hours.  Invalid input(s): FREET3 Anemia work up: No results for input(s): VITAMINB12, FOLATE, FERRITIN, TIBC, IRON, RETICCTPCT in the last 72 hours. Sepsis Labs: Recent Labs  Lab 02/25/18 1403 02/26/18 0624  WBC 10.8* 7.9   Microbiology Recent Results (from the past 240 hour(s))  Culture, blood (Routine X 2) w Reflex to ID Panel     Status: None (Preliminary result)   Collection Time: 02/26/18   3:33 AM  Result Value Ref Range Status   Specimen Description BLOOD LEFT FOREARM  Final   Special Requests   Final    BOTTLES DRAWN AEROBIC AND ANAEROBIC Blood Culture adequate volume   Culture   Final    NO GROWTH 1 DAY Performed at [facility], [address], [place], [place] [address]    Report Status PENDING  Incomplete  Culture, blood (Routine X 2) w Reflex to ID Panel     Status: None (Preliminary result)   Collection Time: 02/26/18  3:41 AM  Result Value Ref Range Status   Specimen Description BLOOD RIGHT FOREARM  Final   Special Requests   Final    BOTTLES DRAWN AEROBIC AND ANAEROBIC Blood Culture adequate volume   Culture   Final    NO GROWTH 1 DAY Performed at [facility], [address], [place], [place] [address]    Report Status PENDING  Incomplete     Medications:   . Influenza vac split quadrivalent PF  0.5 mL Intramuscular Tomorrow-1000   Continuous Infusions: . sodium chloride Stopped (02/26/18 1002)  . cefTRIAXone (ROCEPHIN)  IV 200 mL/hr at 02/27/18 0515  . vancomycin Stopped (02/26/18 2255)      LOS: 0 days   Marinda Elk  [facility] Pager [phone]  *Please refer to [email or web address], password [email or web address] to get updated schedule on who will round on this patient, as hospitalists switch teams weekly. If 7PM-7AM, please contact night-coverage at [email or web address], password [email or web address] for any overnight needs.  02/27/2018, 9:30 AM

## 2018-02-27 NOTE — Progress Notes (Addendum)
Patient pending discharged this afternoon, requested to stay tonight due to no transportation. Would like to go home early AM. MD notified.

## 2018-02-27 NOTE — Plan of Care (Signed)

## 2018-02-27 NOTE — Care Management Note (Signed)
Case Management Note  Patient Details  Name: Russell Warren MRN: [id number] Date of Birth: 1961-01-05  Subjective/Objective:                    Action/Plan:  Patient from home with wife. 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 Expected Discharge Date:                  Expected Discharge Plan:  Home w Home Health Services  In-House Referral:     Discharge planning Services  CM Consult  Post Acute Care Choice:  Home Health Choice offered to:  Patient  DME Arranged:  N/A DME Agency:  NA  HH Arranged:  RN, IV Antibiotics HH Agency:  [facility]  Status of Service:  Completed, signed off  If discussed at Long Length of Stay Meetings, dates discussed:    Additional Comments:  Kingsley Plan, RN 02/27/2018, 10:33 AM

## 2018-02-27 NOTE — Progress Notes (Signed)
PHARMACY CONSULT NOTE FOR:  OUTPATIENT  PARENTERAL ANTIBIOTIC THERAPY (OPAT)  Indication: osteomyelitis Regimen: Ceftriaxone 2gm IV q24h                   Vancomycin 1750mg  IV q12h End date:   04/10/2018  IV antibiotic discharge orders are pended. To discharging provider:  please sign these orders via discharge navigator,  Select New Orders & click on the button choice - Manage This Unsigned Work.     Thank you for allowing pharmacy to be a part of this patient's care.  Christoper Fabian, PharmD, BCPS Clinical pharmacist  **Pharmacist phone directory can now be found on [email or web address] ([email or web address]).  Listed under [facility]. 02/27/2018, 11:42 AM

## 2018-02-27 NOTE — Progress Notes (Signed)
Peripherally Inserted Central Catheter/Midline Placement  The IV Nurse has discussed with the patient and/or persons authorized to consent for the patient, the purpose of this procedure and the potential benefits and risks involved with this procedure.  The benefits include less needle sticks, lab draws from the catheter, and the patient may be discharged home with the catheter. Risks include, but not limited to, infection, bleeding, blood clot (thrombus formation), and puncture of an artery; nerve damage and irregular heartbeat and possibility to perform a PICC exchange if needed/ordered by physician.  Alternatives to this procedure were also discussed.  Bard Power PICC patient education guide, fact sheet on infection prevention and patient information card has been provided to patient /or left at bedside.    PICC/Midline Placement Documentation        Russell Warren 02/27/2018, 4:54 PM

## 2018-02-27 NOTE — Progress Notes (Signed)
Pharmacy Antibiotic Note  Russell Warren is a 57 y.o. male admitted on 02/25/2018 with right foot swelling/concern for osteomyelitis.  Pharmacy has been consulted for Vancomycin dosing. Pt also on ceftriaxone. WBC down to nl. Afebrile. SCr stable, UOP good.  ID following and recommends Vanc/Ceftriaxone for 6 weeks (stop date of 04/10/2018). PICC to be placed today. Noted o/p antibiotic therapy consult.  Vancomycin trough 7 mcg/ml (subtherapeutic) for goal of 15-20 mcg/ml on 1250mg  IV q12h  9/25 Vanc>> (11/7) 9/25 Ceftriaxone>> (11/7)  9/25 BCx>>ngtd  Plan: Change Vancomycin to 1750mg  IV q12 (ideally would like to keep pt on q12h dosing if possible for outpatient administration) F/u renal function F/u vanc trough at [facility] OPAT note done and orders pended  Temp (24hrs), Avg:98.1 F (36.7 C), Min:97.8 F (36.6 C), Max:98.4 F (36.9 C)  Recent Labs  Lab 02/25/18 1403 02/26/18 0624 02/27/18 1014  WBC 10.8* 7.9  --   CREATININE 0.85 0.80  --   VANCOTROUGH  --   --  7*    CrCl cannot be calculated (Unknown ideal weight.).    Allergies  Allergen Reactions  . Latex Rash    Christoper Fabian, PharmD, BCPS Clinical pharmacist  **Pharmacist phone directory can now be found on [email or web address] ([email or web address]).  Listed under [facility]. 02/27/2018 11:08 AM

## 2018-02-28 DIAGNOSIS — L309 Dermatitis, unspecified: Secondary | ICD-10-CM | POA: Diagnosis not present

## 2018-02-28 DIAGNOSIS — M869 Osteomyelitis, unspecified: Secondary | ICD-10-CM | POA: Diagnosis not present

## 2018-02-28 LAB — HIGH SENSITIVITY CRP: CRP, High Sensitivity: 44.67 mg/L — ABNORMAL HIGH (ref 0.00–3.00)

## 2018-02-28 MED ORDER — HEPARIN SOD (PORK) LOCK FLUSH 100 UNIT/ML IV SOLN
250.0000 [IU] | INTRAVENOUS | Status: AC | PRN
  Administered 2018-02-28: 250 [IU]

## 2018-02-28 NOTE — Progress Notes (Signed)
Discharge home. Home instruction given, no question verbalized

## 2018-02-28 NOTE — Discharge Summary (Addendum)
Physician Discharge Summary  Russell Warren SVX:[id number] DOB: 1960/10/09 DOA: 02/25/2018  PCP: Patient, No Pcp Per  Admit date: 02/25/2018 Discharge date: 02/28/2018  Admitted From: home Disposition:  Home  Recommendations for Outpatient Follow-up:  1. Follow up with PCP in 1-2 weeks 2. Please obtain BMP/CBC in one week 3. Follow-up with the ID clinic in 4 to 6 weeks to remove PICC line.   Home Health:No Equipment/Devices:None  Discharge Condition:stable CODE STATUS:full Diet recommendation: Heart Healthy  Brief/Interim Summary: 57 y.o. male with no past medical history has been experiencing increased pain and swelling of the right foot for 1 week MRI of the foot was done that showed right foot fifth metatarsal possible osteomyelitis cultures drawn  Discharge Diagnoses:  Principal Problem:   Osteomyelitis ([place]) Active Problems:   Foot pain, right   Osteomyelitis of right foot ([place])  Osteomyelitis of the right foot: MRI of the right foot was done blood cultures were ordered which remain negative, on admission he was started empirically on IV antibiotics. Infectious disease was consulted recommended IV vancomycin and Rocephin for 6 weeks. PICC line was placed. To follow-up with ID as an outpatient after 6 weeks of IV antibiotics can probably DC PICC line.  Discharge Instructions  Discharge Instructions    Diet - low sodium heart healthy   Complete by:  As directed    Home infusion instructions [facility] May follow [place] Dosing Protocol; May administer Cathflo as needed to maintain patency of vascular access device.; [place] of vascular access device: per [facility] Protocol: 0.9% NaCl pre/post medica...   Complete by:  As directed    Instructions:  May follow [place] Dosing Protocol   Instructions:  May administer Cathflo as needed to maintain patency of vascular access device.   Instructions:  [place] of vascular access device: per [facility] Protocol: 0.9%  NaCl pre/post medication administration and prn patency; Heparin 100 u/ml, 51m for implanted ports and Heparin 10u/ml, 560mfor all other central venous catheters.   Instructions:  May follow [facility] Anaphylaxis Protocol for First Dose Administration in the home: 0.9% NaCl at 25-50 ml/hr to maintain IV access for protocol meds. Epinephrine 0.3 ml IV/IM PRN and Benadryl 25-50 IV/IM PRN s/s of anaphylaxis.   Instructions:  [facility] Coordinator (RN) to assist per patient IV care needs in the home PRN.   Increase activity slowly   Complete by:  As directed      Allergies as of 02/28/2018      Reactions   Latex Rash      Medication List    STOP taking these medications   amoxicillin-clavulanate 875-125 MG tablet Commonly known as:  AUGMENTIN   cephALEXin 500 MG capsule Commonly known as:  KEFLEX   clindamycin 300 MG capsule Commonly known as:  CLEOCIN   KEFLEX 500 MG capsule Generic drug:  cephALEXin     TAKE these medications   cefTRIAXone  IVPB Commonly known as:  ROCEPHIN Inject 2 g into the vein daily. Indication:  osteomyelitis Last Day of Therapy:  04/10/2018 Labs - Once weekly:  CBC/D and BMP, Labs - Every other week:  ESR and CRP   cefTRIAXone 2 g in sodium chloride 0.9 % 100 mL Inject 2 g into the vein daily.   naproxen 500 MG tablet Commonly known as:  NAPROSYN Take 1 tablet (500 mg total) by mouth 2 (two) times daily.   vancomycin  IVPB Inject 1,750 mg into the vein every 12 (twelve) hours.  Indication:  osteomyelitis Last Day of Therapy:  04/10/2018 Labs - _0 /26/19 1716         Follow-up Information    Go to  [place].   Specialty:  Emergency Medicine Why:  As needed, If symptoms worsen Contact information: [address] [id number] [place] [phone]       Schedule an appointment as soon as possible for a visit  with Your doctor.          Allergies  Allergen Reactions  . Latex Rash    Consultations:  ID   Procedures/Studies: Mr Foot Right Wo Contrast  Result Date: 02/26/2018 CLINICAL DATA:  57 year old male presents with worsening right foot swelling for over a week. Has been seen in urgent care and was ruled out for gout and was treated for cellulitis. States that the redness top of his foot has been increasing EXAM: MRI OF THE RIGHT FOREFOOT WITHOUT CONTRAST TECHNIQUE: Multiplanar, multisequence MR imaging of the right forefoot was performed. No intravenous contrast was  administered. COMPARISON:  None. FINDINGS: Bones/Joint/Cartilage Marrow edema within the mid and proximal shaft of the fifth metatarsal with a cystic area in the proximal shaft. Cortical irregularity along the plantar aspect of the base of the fifth metatarsal and mild cortical thickening of the base and proximal shaft. Severe bone marrow edema in the proximal second metatarsal. Moderate osteoarthritis of the second tarsometatarsal joint with subchondral marrow edema on either side of the joint. Bone marrow edema in the proximal half of the fourth metatarsal. Mild osteoarthritis of the first TMT joint. Mild osteoarthritis of the third TMT joint. Mild osteoarthritis of the first MTP joint. No fracture or dislocation. Normal alignment. No joint effusion. Ligaments Collateral ligaments are intact.  Lisfranc ligament is intact. Muscles and Tendons Flexor and peroneal tendons are grossly intact. 16 x 16 mm cystic area along the dorsal aspect of the second tarsometatarsal joint concerning for a ganglion cyst. Multiloculated cystic area along the dorsal aspect of the foot along the  course of the extensor digitorum tendons at the level of the fourth and fifth TMT joints most concerning for a ganglion cyst measuring 8 x 8 x 40 mm. 6 x 6 mm cystic area along the musculotendinous junction of abductor hallucis muscle. Soft tissue Severe soft tissue edema along the dorsal aspect of the foot most severe laterally. No soft tissue mass. IMPRESSION: 1. Marrow edema within the mid and proximal shaft of the fifth metatarsal with a cystic area in the proximal shaft. Cortical irregularity along the plantar aspect of the base of the fifth metatarsal and mild cortical thickening of the base and proximal shaft. Differential considerations include healing fracture versus chronic osteomyelitis given the severe overlying soft tissue edema. 2. Abnormal marrow signal in the second and fourth metatarsal shafts which likely reflects stress  reaction without a fracture 3. Two dominant cystic areas along the dorsal aspect of foot with one located along the second tarsometatarsal joint and a second multiloculated cystic along the dorsal lateral aspect most consistent with ganglion cysts. 4. Moderate osteoarthritis of the second tarsometatarsal joint. Mild osteoarthritis of the first, third and fourth tarsometatarsal joints. Electronically Signed   By: Kathreen Devoid   On: 02/26/2018 08:02   Dg Foot Complete Right  Result Date: 02/25/2018 CLINICAL DATA:  Pain while walking.  Swelling. EXAM: RIGHT FOOT COMPLETE - 3+ VIEW COMPARISON:  No prior. FINDINGS: Diffuse soft tissue swelling. Degenerative changes are noted diffusely with degenerative changes most prominent about the first MTP joint. A lytic lesion is noted the base of the right fifth metatarsal. This could represent an active lesion including infection or tumor. An enchondroma could present in this fashion. MRI of the right foot suggested for further evaluation. IMPRESSION: 1. Diffuse soft tissue swelling. Diffuse mild degenerative change. Degenerative changes most prominent at the first MTP joint. 2. Lytic lesion noted in the base of the right fifth metatarsal for which MRI of the right foot suggested for further evaluation Electronically Signed   By: Marcello Moores  Register   On: 02/25/2018 14:13   [place] Ekg Site Rite  Result Date: 02/27/2018 If Site Rite image not attached, placement could not be confirmed due to current cardiac rhythm.    Subjective: No new complaints feels great.  Discharge Exam: Vitals:   02/27/18 2100 02/28/18 0445  BP: (!) 153/95 134/90  Pulse: (!) 58 67  Resp: 16 18  Temp: 98.2 F (36.8 C) 97.8 F (36.6 C)  SpO2: 96% 97%   Vitals:   02/27/18 1135 02/27/18 1339 02/27/18 2100 02/28/18 0445  BP: (!) 136/96 (!) 133/93 (!) 153/95 134/90  Pulse:  66 (!) 58 67  Resp:  _0 Temp:  98 F (36.7 C) 98.2 F (36.8 C) 97.8 F (36.6 C)  TempSrc:  Oral Oral Oral   SpO2:  93% 96% 97%  Weight: 98.5 kg     Height:        General: Pt is alert, awake, not in acute distress Cardiovascular: RRR, S1/S2 +, no rubs, no gallops Respiratory: CTA bilaterally, no wheezing, no rhonchi Abdominal: Soft, NT, ND, bowel sounds + Extremities: no edema, no cyanosis    The results of significant diagnostics from this hospitalization (including imaging, microbiology, ancillary and laboratory) are listed below for reference.     Microbiology: Recent Results (from the past 240 hour(s))  Culture, blood (Routine X 2) w Reflex to ID Panel     Status: None (Preliminary result)   Collection Time: 02/26/18  3:33 AM  Result Value Ref Range Status   Specimen Description BLOOD LEFT FOREARM  Final   Special Requests   Final    BOTTLES DRAWN AEROBIC AND ANAEROBIC Blood Culture adequate volume   Culture   Final    NO GROWTH 2 DAYS Performed at [facility], [address], [place], [place] [address]    Report Status PENDING  Incomplete  Culture, blood (Routine X 2) w Reflex to ID Panel     Status: None (Preliminary result)   Collection Time: 02/26/18  3:41 AM  Result Value Ref Range Status   Specimen Description BLOOD RIGHT FOREARM  Final   Special Requests   Final    BOTTLES DRAWN AEROBIC AND ANAEROBIC Blood Culture adequate volume   Culture   Final    NO GROWTH 2 DAYS Performed at [facility], [place] [address], [place], [place] [address]    Report Status PENDING  Incomplete     Labs: BNP (last 3 results) No results for input(s): BNP in the last 8760 hours. Basic Metabolic Panel: Recent Labs  Lab 02/25/18 1403 02/26/18 0624  NA 139 139  K 4.0 3.8  CL 101 103  CO2 27 25  GLUCOSE 99 108*  BUN 10 12  CREATININE 0.85 0.80  CALCIUM 9.5 9.2   Liver Function Tests: No results for input(s): AST, ALT, ALKPHOS, BILITOT, PROT, ALBUMIN in the last 168 hours. No results for input(s): LIPASE, AMYLASE in the last 168 hours. No results for input(s):  AMMONIA in the last 168 hours. CBC: Recent Labs  Lab 02/25/18 1403 02/26/18 0624  WBC 10.8* 7.9  NEUTROABS 8.1*  --   HGB 15.8 15.3  HCT 48.3 45.7  MCV 93.6 90.7  PLT 215 219   Cardiac Enzymes: No results for input(s): CKTOTAL, CKMB, CKMBINDEX, TROPONINI in the last 168 hours. BNP: Invalid input(s): POCBNP CBG: No results for input(s): GLUCAP in the last 168 hours. D-Dimer No results for input(s): DDIMER in the last 72 hours. Hgb A1c No results for input(s): HGBA1C in the last 72 hours. Lipid Profile No results for input(s): CHOL, HDL, LDLCALC, TRIG, CHOLHDL, LDLDIRECT in the last 72 hours. Thyroid function studies No results for input(s): TSH, T4TOTAL, T3FREE, THYROIDAB in the last 72 hours.  Invalid input(s): FREET3 Anemia work up No results for input(s): VITAMINB12, FOLATE, FERRITIN, TIBC, IRON, RETICCTPCT in the last 72 hours. Urinalysis No results found for: COLORURINE, APPEARANCEUR, [place], [place], [place], [place], [place], [place], PROTEINUR, UROBILINOGEN, NITRITE, LEUKOCYTESUR Sepsis Labs Invalid input(s): PROCALCITONIN,  WBC,  LACTICIDVEN Microbiology Recent Results (from the past 240 hour(s))  Culture, blood (Routine X 2) w Reflex to ID Panel     Status: None (Preliminary result)   Collection Time: 02/26/18  3:33 AM  Result Value Ref Range Status   Specimen Description BLOOD LEFT FOREARM  Final   Special Requests   Final    BOTTLES DRAWN AEROBIC AND ANAEROBIC Blood Culture adequate volume   Culture   Final    NO GROWTH 2 DAYS Performed at [facility], [address] [place], [place] [address]    Report Status PENDING  Incomplete  Culture, blood (Routine X 2) w Reflex to ID Panel     Status: None (Preliminary result)   Collection Time: 02/26/18  3:41 AM  Result Value Ref Range Status   Specimen Description BLOOD RIGHT FOREARM  Final   Special Requests   Final    BOTTLES DRAWN AEROBIC AND ANAEROBIC Blood Culture adequate  volume   Culture    Final    NO GROWTH 2 DAYS Performed at [facility], [place] [address] [place], [place] [address]    Report Status PENDING  Incomplete     Time coordinating discharge: 40 minutes  SIGNED:   Charlynne Cousins, MD  [facility] 02/28/2018, 7:08 AM Pager   If 7PM-7AM, please contact night-coverage [email or web address] Password [email or web address]

## 2018-03-01 DIAGNOSIS — M869 Osteomyelitis, unspecified: Secondary | ICD-10-CM | POA: Diagnosis not present

## 2018-03-03 DIAGNOSIS — M869 Osteomyelitis, unspecified: Secondary | ICD-10-CM | POA: Diagnosis not present

## 2018-03-03 DIAGNOSIS — Z792 Long term (current) use of antibiotics: Secondary | ICD-10-CM | POA: Diagnosis not present

## 2018-03-03 LAB — CULTURE, BLOOD (ROUTINE X 2)
CULTURE: NO GROWTH
CULTURE: NO GROWTH
Special Requests: ADEQUATE
Special Requests: ADEQUATE

## 2018-03-06 DIAGNOSIS — Z5181 Encounter for therapeutic drug level monitoring: Secondary | ICD-10-CM | POA: Diagnosis not present

## 2018-03-06 DIAGNOSIS — M869 Osteomyelitis, unspecified: Secondary | ICD-10-CM | POA: Diagnosis not present

## 2018-03-07 ENCOUNTER — Encounter: Payer: Self-pay | Admitting: Family

## 2018-03-07 ENCOUNTER — Ambulatory Visit (INDEPENDENT_AMBULATORY_CARE_PROVIDER_SITE_OTHER): Payer: BLUE CROSS/BLUE SHIELD | Admitting: Family

## 2018-03-07 VITALS — BP 129/94 | HR 85 | Temp 98.0°F | Wt 218.0 lb

## 2018-03-07 DIAGNOSIS — M86171 Other acute osteomyelitis, right ankle and foot: Secondary | ICD-10-CM | POA: Diagnosis not present

## 2018-03-07 NOTE — Assessment & Plan Note (Signed)
Russell Warren continues to be treated for likely osteomyelitis of the right foot with no injury or wound present. He is doing well with his antimicrobial therapy of vancomycin and ceftriaxone with no adverse side effects or missed doses. PICC line is without infection. Continue current dose of vancomycin and ceftriaxone with end date of 04/10/18. Received FMLA and Short Term Disability Paperwork to completed. Follow up in 5 weeks or sooner if needed.

## 2018-03-07 NOTE — Progress Notes (Signed)
Subjective:    Patient ID: Russell Warren, male    DOB: [birth year], 57 y.o.   MRN: [id number]  Chief Complaint  Patient presents with  . Osteomyelitis     HPI:  Russell Warren is a 57 y.o. male who presents today for initial office visit following hospitalization for right foot pain.  Russell Warren was admitted to [facility] on 9/25 with increasing pain and swelling of the right foot that had been going on for about week prior to presentation. There was no trauma or significant injury that he could recall. X-ray imaging showed soft tissue swelling with a lytic lesion at the base of the 5th metatarsal. MRI with marrow edema within the mid and proximal shaft of the fifth metatarsal concerning for healing fracture or osteomyelitis. CRP was 7.6 and ESR was 14. He was initially started on vancomycin and cefepime. Orthopedics favored infection over musculoskeletal origin with the recommendation of treating for both. A PICC line was placed in his right upper arm and he was discharged with vancomycin and ceftriaxone with end date of 04/10/18. All hospital records, labs and imaging were reviewed in detail.  Russell Warren has been receiving his vancomycin and ceftriaxone with no adverse side effects or missed doses. His foot is feeling a little bit better each day. Denies fevers, chills or sweats. He does have some mild itching located around his PICC site near the dressing. He does not yet have a follow up with orthopedics.    Allergies  Allergen Reactions  . Latex Rash      Outpatient Medications Prior to Visit  Medication Sig Dispense Refill  . cefTRIAXone (ROCEPHIN) IVPB Inject 2 g into the vein daily. Indication:  osteomyelitis Last Day of Therapy:  04/10/2018 Labs - Once weekly:  CBC/D and BMP, Labs - Every other week:  ESR and CRP 41 Units 0  . cefTRIAXone 2 g in sodium chloride 0.9 % 100 mL Inject 2 g into the vein daily.    . naproxen (NAPROSYN) 500 MG tablet Take 1 tablet  (500 mg total) by mouth 2 (two) times daily. 20 tablet 0  . vancomycin 1,750 mg in sodium chloride 0.9 % 500 mL Inject 1,750 mg into the vein every 12 (twelve) hours.    . vancomycin IVPB Inject 1,750 mg into the vein every 12 (twelve) hours. Indication:  osteomyelitis Last Day of Therapy:  04/10/2018 Labs - Sunday/Monday:  CBC/D, BMP, and vancomycin trough. Labs - Thursday:  BMP and vancomycin trough Labs - Every other week:  ESR and CRP 82 Units 0   No facility-administered medications prior to visit.      Past Medical History:  Diagnosis Date  . Eczema   . History of mumps   . History of scarlet fever   . HOH (hard of hearing)   . Meningitis spinal    history of twice age 76 & 58      Past Surgical History:  Procedure Laterality Date  . BACK SURGERY  1999  . DENTAL SURGERY    . MIDDLE EAR SURGERY Bilateral       Family History  Problem Relation Age of Onset  . Heart disease Father   . Diabetes Brother       Social History   Socioeconomic History  . Marital status: Married    Spouse name: Not on file  . Number of children: Not on file  . Years of education: Not on file  . Highest education level:  Not on file  Occupational History  . Not on file  Social Needs  . Financial resource strain: Not on file  . Food insecurity:    Worry: Not on file    Inability: Not on file  . Transportation needs:    Medical: Not on file    Non-medical: Not on file  Tobacco Use  . Smoking status: Never Smoker  . Smokeless tobacco: Never Used  Substance and Sexual Activity  . Alcohol use: No  . Drug use: Yes    Frequency: 4.0 times per week    Types: Marijuana    Comment: this afternoon  . Sexual activity: Not Currently  Lifestyle  . Physical activity:    Days per week: Not on file    Minutes per session: Not on file  . Stress: Not on file  Relationships  . Social connections:    Talks on phone: Not on file    Gets together: Not on file    Attends religious  service: Not on file    Active member of club or organization: Not on file    Attends meetings of clubs or organizations: Not on file    Relationship status: Not on file  . Intimate partner violence:    Fear of current or ex partner: Not on file    Emotionally abused: Not on file    Physically abused: Not on file    Forced sexual activity: Not on file  Other Topics Concern  . Not on file  Social History Narrative  . Not on file      Review of Systems  Constitutional: Negative for chills, diaphoresis, fatigue and fever.  Respiratory: Negative for chest tightness and shortness of breath.   Cardiovascular: Negative for chest pain.  Musculoskeletal:       Positive for right foot pain.   Neurological: Negative for weakness.       Objective:    BP (!) 129/94   Pulse 85   Temp 98 F (36.7 C)   Wt 218 lb (98.9 kg)   BMI 29.98 kg/m  Nursing note and vital signs reviewed.  Physical Exam  Constitutional: He is oriented to person, place, and time. He appears well-developed and well-nourished. No distress.  Cardiovascular: Normal rate, regular rhythm, normal heart sounds and intact distal pulses.  Pulmonary/Chest: Effort normal and breath sounds normal.  Musculoskeletal:  Right foot remains with edema and tenderness on the dorsal aspect of the foot and ankle. Pulses and sensation are intact and appropriate.   Neurological: He is alert and oriented to person, place, and time.  Skin: Skin is warm and dry.  Psychiatric: He has a normal mood and affect.        Assessment & Plan:   Problem List Items Addressed This Visit      Musculoskeletal and Integument   Osteomyelitis of right foot ([place]) - Primary    Russell Warren continues to be treated for likely osteomyelitis of the right foot with no injury or wound present. He is doing well with his antimicrobial therapy of vancomycin and ceftriaxone with no adverse side effects or missed doses. PICC line is without infection. Continue  current dose of vancomycin and ceftriaxone with end date of 04/10/18. Received FMLA and Short Term Disability Paperwork to completed. Follow up in 5 weeks or sooner if needed.           I am having Sheriff A. Colledge maintain his naproxen, cefTRIAXone, vancomycin, cefTRIAXone 2 g in sodium chloride  0.9 % 100 mL, and (vancomycin 1,750 mg in sodium chloride 0.9 % 500 mL).   Follow-up: Return in about 5 weeks (around 04/11/2018).    Terri Piedra, MSN, FNP-C Nurse Practitioner [facility] [place] [facility] Office phone: [phone] Pager: [place] number: [phone]

## 2018-03-07 NOTE — Patient Instructions (Addendum)
Nice to see you.  Glad that your foot is getting better.   Continue to take the Vancomycin and Ceftriaxone as prescribed.  We will continue to monitor your lab work.   Follow up in 5 weeks (Week of Nov 4th)  Please call for appointment:  Dr. Victorino Dike [address], [place], [place] [address] Phone: [phone]

## 2018-03-08 DIAGNOSIS — L309 Dermatitis, unspecified: Secondary | ICD-10-CM | POA: Diagnosis not present

## 2018-03-08 DIAGNOSIS — M869 Osteomyelitis, unspecified: Secondary | ICD-10-CM | POA: Diagnosis not present

## 2018-03-09 DIAGNOSIS — Z5181 Encounter for therapeutic drug level monitoring: Secondary | ICD-10-CM | POA: Diagnosis not present

## 2018-03-10 ENCOUNTER — Telehealth: Payer: Self-pay | Admitting: Behavioral Health

## 2018-03-10 DIAGNOSIS — M869 Osteomyelitis, unspecified: Secondary | ICD-10-CM | POA: Diagnosis not present

## 2018-03-10 NOTE — Telephone Encounter (Signed)
Patient called to see if FMLA paperwork was completed.  Informed him that paperwork was completed and placed up front for him to retrieve.     Also patient states he was told to call Dr. Laverta [place] off ice to schedule a follow up visit.  He states the office would not schedule him an appointment because Dr. Victorino Dike has never seen the patient.  Per Hospital notes from Orthopedics patient is to follow up with Dr. Victorino Dike as well.  Called Dr. Laverta Baltimore office and scheduled patient appointment first available 04/11/2018 at 8:00 am.

## 2018-03-10 NOTE — Telephone Encounter (Signed)
Attempted to call patient to inform him of appointment with Dr. Victorino Dike April 11, 2018 at 8:00am.  There was no voicemail to leave a message on.   Angeline Slim RN

## 2018-03-12 ENCOUNTER — Ambulatory Visit: Payer: BLUE CROSS/BLUE SHIELD | Admitting: Internal Medicine

## 2018-03-13 DIAGNOSIS — M869 Osteomyelitis, unspecified: Secondary | ICD-10-CM | POA: Diagnosis not present

## 2018-03-13 DIAGNOSIS — Z792 Long term (current) use of antibiotics: Secondary | ICD-10-CM | POA: Diagnosis not present

## 2018-03-14 DIAGNOSIS — M869 Osteomyelitis, unspecified: Secondary | ICD-10-CM | POA: Diagnosis not present

## 2018-03-15 DIAGNOSIS — L309 Dermatitis, unspecified: Secondary | ICD-10-CM | POA: Diagnosis not present

## 2018-03-15 DIAGNOSIS — M869 Osteomyelitis, unspecified: Secondary | ICD-10-CM | POA: Diagnosis not present

## 2018-03-17 DIAGNOSIS — M869 Osteomyelitis, unspecified: Secondary | ICD-10-CM | POA: Diagnosis not present

## 2018-03-17 DIAGNOSIS — Z792 Long term (current) use of antibiotics: Secondary | ICD-10-CM | POA: Diagnosis not present

## 2018-03-20 DIAGNOSIS — Z792 Long term (current) use of antibiotics: Secondary | ICD-10-CM | POA: Diagnosis not present

## 2018-03-20 DIAGNOSIS — M869 Osteomyelitis, unspecified: Secondary | ICD-10-CM | POA: Diagnosis not present

## 2018-03-22 DIAGNOSIS — L309 Dermatitis, unspecified: Secondary | ICD-10-CM | POA: Diagnosis not present

## 2018-03-22 DIAGNOSIS — M869 Osteomyelitis, unspecified: Secondary | ICD-10-CM | POA: Diagnosis not present

## 2018-03-24 DIAGNOSIS — Z792 Long term (current) use of antibiotics: Secondary | ICD-10-CM | POA: Diagnosis not present

## 2018-03-24 DIAGNOSIS — M869 Osteomyelitis, unspecified: Secondary | ICD-10-CM | POA: Diagnosis not present

## 2018-03-27 DIAGNOSIS — M869 Osteomyelitis, unspecified: Secondary | ICD-10-CM | POA: Diagnosis not present

## 2018-03-27 DIAGNOSIS — Z792 Long term (current) use of antibiotics: Secondary | ICD-10-CM | POA: Diagnosis not present

## 2018-03-29 DIAGNOSIS — M869 Osteomyelitis, unspecified: Secondary | ICD-10-CM | POA: Diagnosis not present

## 2018-03-29 DIAGNOSIS — L309 Dermatitis, unspecified: Secondary | ICD-10-CM | POA: Diagnosis not present

## 2018-03-31 DIAGNOSIS — M869 Osteomyelitis, unspecified: Secondary | ICD-10-CM | POA: Diagnosis not present

## 2018-03-31 DIAGNOSIS — Z5181 Encounter for therapeutic drug level monitoring: Secondary | ICD-10-CM | POA: Diagnosis not present

## 2018-04-03 DIAGNOSIS — M869 Osteomyelitis, unspecified: Secondary | ICD-10-CM | POA: Diagnosis not present

## 2018-04-03 DIAGNOSIS — Z792 Long term (current) use of antibiotics: Secondary | ICD-10-CM | POA: Diagnosis not present

## 2018-04-05 DIAGNOSIS — L309 Dermatitis, unspecified: Secondary | ICD-10-CM | POA: Diagnosis not present

## 2018-04-05 DIAGNOSIS — M869 Osteomyelitis, unspecified: Secondary | ICD-10-CM | POA: Diagnosis not present

## 2018-04-07 DIAGNOSIS — Z5181 Encounter for therapeutic drug level monitoring: Secondary | ICD-10-CM | POA: Diagnosis not present

## 2018-04-07 DIAGNOSIS — M869 Osteomyelitis, unspecified: Secondary | ICD-10-CM | POA: Diagnosis not present

## 2018-04-10 ENCOUNTER — Encounter: Payer: Self-pay | Admitting: Family

## 2018-04-10 ENCOUNTER — Telehealth: Payer: Self-pay

## 2018-04-10 ENCOUNTER — Ambulatory Visit (INDEPENDENT_AMBULATORY_CARE_PROVIDER_SITE_OTHER): Payer: BLUE CROSS/BLUE SHIELD | Admitting: Family

## 2018-04-10 VITALS — BP 125/89 | HR 72 | Temp 98.0°F | Wt 220.0 lb

## 2018-04-10 DIAGNOSIS — M869 Osteomyelitis, unspecified: Secondary | ICD-10-CM | POA: Diagnosis not present

## 2018-04-10 DIAGNOSIS — Z792 Long term (current) use of antibiotics: Secondary | ICD-10-CM | POA: Diagnosis not present

## 2018-04-10 DIAGNOSIS — M86171 Other acute osteomyelitis, right ankle and foot: Secondary | ICD-10-CM | POA: Diagnosis not present

## 2018-04-10 NOTE — Assessment & Plan Note (Signed)
Russell Warren is completing his 6 weeks of antimicrobial therapy with vancomycin and ceftriaxone for uncertain osteomyelitis. While he continues to experience improvement, I am not convinced this was true infection as he continues to have mechanical symptoms. He has follow up with orthopedics tomorrow. Complete vancomycin and ceftriaxone today and will have PICC line removed. Inflammatory markers remain normal. Follow up in 1 month or sooner if needed.

## 2018-04-10 NOTE — Patient Instructions (Addendum)
Nice to see you.  Complete your antibiotics tonight.  We will have PICC line removed tomorrow.   Follow up with Dr. Laverta Baltimore office tomorrow at 8am.  [facility] [address], [place], [place] [address] Phone: [phone] Appointments: [email or web address]  Follow up in 1 month or sooner if needed. May cancel if not needed.

## 2018-04-10 NOTE — Telephone Encounter (Signed)
Per Marcos Eke, Np called [facility] with orders to pull picc. Spoke with Melissa who was able to confirm antibiotic end date, and take a verbal order to pull picc after last dose. Melissa was able to confirm orders before ending call Lorenso Courier, CMA

## 2018-04-10 NOTE — Progress Notes (Signed)
Subjective:    Patient ID: Russell Warren, male    DOB: 1960/08/09, 57 y.o.   MRN: [id number]  Chief Complaint  Patient presents with  . Osteomyelitis      HPI:  Russell Warren is a 57 y.o. male who presents today for follow up of osteomyelitis.   Russell Warren was last seen in the office on 03/07/18 and maintained on his IV antimicrobial therapy of vancomycin and ceftriaxone with concern for osteomyelitis or possible stress fracture. End date of antimicrobial therapy was set for 04/10/18 for 6 weeks of therapy. Inflammatory markers in the hospital were CRP 7.6 and ESR 14. His most recent lab work on 03/31/18 with inflammatory markers of CRP 7 and ESR of 11.  Russell Warren continues to receive his vancomycin and ceftriaxone as prescribed with no adverse side effects or missed doses. Has continued to see improvements in his right foot, however has edema on the dorsum and pain around the fifth metatarsal. Able to wear normal shoes now. Pain is described as shooting and occurs infrequently.  Denies fevers, chills or sweats.     Allergies  Allergen Reactions  . Latex Rash      Outpatient Medications Prior to Visit  Medication Sig Dispense Refill  . naproxen (NAPROSYN) 500 MG tablet Take 1 tablet (500 mg total) by mouth 2 (two) times daily. 20 tablet 0  . cefTRIAXone (ROCEPHIN) IVPB Inject 2 g into the vein daily. Indication:  osteomyelitis Last Day of Therapy:  04/10/2018 Labs - Once weekly:  CBC/D and BMP, Labs - Every other week:  ESR and CRP 41 Units 0  . cefTRIAXone 2 g in sodium chloride 0.9 % 100 mL Inject 2 g into the vein daily.    . vancomycin 1,750 mg in sodium chloride 0.9 % 500 mL Inject 1,750 mg into the vein every 12 (twelve) hours.    . vancomycin IVPB Inject 1,750 mg into the vein every 12 (twelve) hours. Indication:  osteomyelitis Last Day of Therapy:  04/10/2018 Labs - Sunday/Monday:  CBC/D, BMP, and vancomycin trough. Labs - Thursday:  BMP and vancomycin  trough Labs - Every other week:  ESR and CRP 82 Units 0   No facility-administered medications prior to visit.      Past Medical History:  Diagnosis Date  . Eczema   . History of mumps   . History of scarlet fever   . HOH (hard of hearing)   . Meningitis spinal    history of twice age 49 & 61     Past Surgical History:  Procedure Laterality Date  . BACK SURGERY  1999  . DENTAL SURGERY    . MIDDLE EAR SURGERY Bilateral        Review of Systems  Constitutional: Negative for chills and fever.  Respiratory: Negative for chest tightness and shortness of breath.   Cardiovascular: Negative for chest pain.  Gastrointestinal: Negative for abdominal distention, abdominal pain, constipation, diarrhea and nausea.  Musculoskeletal:       Positive for right foot pain and edema.       Objective:    BP 125/89   Pulse 72   Temp 98 F (36.7 C) (Oral)   Wt 220 lb (99.8 kg)   BMI 30.26 kg/m  Nursing note and vital signs reviewed.  Physical Exam  Constitutional: He is oriented to person, place, and time. He appears well-developed and well-nourished. No distress.  HENT:  Mouth/Throat: Abnormal dentition.  Cardiovascular: Normal rate, regular rhythm, normal  heart sounds and intact distal pulses. Exam reveals no gallop and no friction rub.  No murmur heard. Pulmonary/Chest: Effort normal and breath sounds normal. No stridor. No respiratory distress. He has no wheezes. He has no rales. He exhibits no tenderness.  Musculoskeletal:  Right foot with mild edema on the dorsum of the foot and tenderness without crepitus or deformity of the fifth metatarsal head as well as between the 4th and 5th metatarsal. No masses lesions palpable. Pulses and sensation are appropriate.   Neurological: He is alert and oriented to person, place, and time.  Skin: Skin is warm and dry.  Psychiatric: He has a normal mood and affect. His behavior is normal. Judgment and thought content normal.        Assessment & Plan:   Problem List Items Addressed This Visit      Musculoskeletal and Integument   Osteomyelitis ([place]) - Primary    Russell Warren is completing his 6 weeks of antimicrobial therapy with vancomycin and ceftriaxone for uncertain osteomyelitis. While he continues to experience improvement, I am not convinced this was true infection as he continues to have mechanical symptoms. He has follow up with orthopedics tomorrow. Complete vancomycin and ceftriaxone today and will have PICC line removed. Inflammatory markers remain normal. Follow up in 1 month or sooner if needed.          I have discontinued Russell Warren's cefTRIAXone, vancomycin, cefTRIAXone 2 g in sodium chloride 0.9 % 100 mL, and (vancomycin 1,750 mg in sodium chloride 0.9 % 500 mL). I am also having him maintain his naproxen.   Follow-up: Return in about 1 month (around 05/10/2018), or if symptoms worsen or fail to improve.   Terri Piedra, MSN, FNP-C Nurse Practitioner [facility] Office phone: [phone] Pager: [place] number: [phone]

## 2018-04-11 ENCOUNTER — Ambulatory Visit: Payer: BLUE CROSS/BLUE SHIELD | Admitting: Family

## 2018-04-11 DIAGNOSIS — M869 Osteomyelitis, unspecified: Secondary | ICD-10-CM | POA: Diagnosis not present

## 2018-04-11 DIAGNOSIS — M79671 Pain in right foot: Secondary | ICD-10-CM | POA: Diagnosis not present

## 2018-05-09 DIAGNOSIS — R937 Abnormal findings on diagnostic imaging of other parts of musculoskeletal system: Secondary | ICD-10-CM | POA: Diagnosis not present

## 2018-05-09 DIAGNOSIS — M79671 Pain in right foot: Secondary | ICD-10-CM | POA: Diagnosis not present

## 2018-05-20 ENCOUNTER — Telehealth: Payer: Self-pay | Admitting: *Deleted

## 2018-05-20 NOTE — Telephone Encounter (Signed)
Based on this information it does not appear that infection is the likely cause of his symptoms. I am happy to see him in the clinic, but he may need to discuss further with orthopedics (and since orthopedics released him back to work).

## 2018-05-20 NOTE — Telephone Encounter (Signed)
Per Tammy SoursGreg called the patient to advise that he will need to see the ortho until he can be seen 05/26/18. He advised he does not feel the ortho knows what to do with him so he will wait to be seen here 05/26/18.

## 2018-05-20 NOTE — Telephone Encounter (Signed)
-----   Message from [name] sent at 05/20/2018  8:55 AM EST ----- Patient called because orthopedic that he was referred to said that it was ok for him to go back to work, but when he went back to work yesterday his foot got swollen again. Patient is asking to be seen sooner so his disability paperwork can get updated   [phone]

## 2018-05-20 NOTE — Telephone Encounter (Signed)
After checking the schedule there are no available appointments until 05/26/18 when he is to see Tammy SoursGreg. Will forward to provider to see what he suggests.

## 2018-05-26 ENCOUNTER — Encounter: Payer: Self-pay | Admitting: Family

## 2018-05-26 ENCOUNTER — Ambulatory Visit (INDEPENDENT_AMBULATORY_CARE_PROVIDER_SITE_OTHER): Payer: BLUE CROSS/BLUE SHIELD | Admitting: Family

## 2018-05-26 VITALS — BP 132/85 | HR 87 | Temp 98.3°F | Wt 221.0 lb

## 2018-05-26 DIAGNOSIS — M79671 Pain in right foot: Secondary | ICD-10-CM

## 2018-05-26 NOTE — Progress Notes (Signed)
Subjective:    Patient ID: Russell Warren, male    DOB: 1960-07-07, 57 y.o.   MRN: [id number]  Chief Complaint  Patient presents with  . Right Foot Pain     HPI:  Russell Warren is a 57 y.o. male who presents today for continued follow up of his right foot pain / osteomyelitis.  Russell Warren was last seen in the office on 04/10/18 at the completion of 6 weeks of therapy for presumed osteomyelitis of the right foot. Antibiotic therapy with vancomycin and ceftriaxone was discontinued at that time as inflammatory markers were within the normal ranges.  Russell Warren has since been to see Dr. Victorino DikeHewitt with concerns for the lytic lesions and history and exam inconsistent with osteomyelitis. Russell Warren reports having returned to work and after being on his feet all day he had increased amounts of pain and edema. Denies new trauma or injury. Swelling improved over time but continues to have edema on the dorsum of his right foot and a feeling of pressure around the 3rd/4th metatarsal. He has not taken any new antibiotics. Denies fevers, chills, or sweats.    Allergies  Allergen Reactions  . Latex Rash      Outpatient Medications Prior to Visit  Medication Sig Dispense Refill  . naproxen (NAPROSYN) 500 MG tablet Take 1 tablet (500 mg total) by mouth 2 (two) times daily. (Patient not taking: Reported on 05/26/2018) 20 tablet 0   No facility-administered medications prior to visit.      Past Medical History:  Diagnosis Date  . Eczema   . History of mumps   . History of scarlet fever   . HOH (hard of hearing)   . Meningitis spinal    history of twice age 74 & [age]     Past Surgical History:  Procedure Laterality Date  . BACK SURGERY  1999  . DENTAL SURGERY    . MIDDLE EAR SURGERY Bilateral     Review of Systems  Constitutional: Negative for chills, fatigue and fever.  Respiratory: Negative for chest tightness, shortness of breath and wheezing.   Cardiovascular: Negative  for chest pain.  Gastrointestinal: Negative for abdominal pain, constipation, diarrhea and nausea.  Musculoskeletal:       Positive for right foot pain.       Objective:    BP 132/85   Pulse 87   Temp 98.3 F (36.8 C)   Wt 221 lb (100.2 kg)   BMI 30.39 kg/m  Nursing note and vital signs reviewed.  Physical Exam Constitutional:      General: He is not in acute distress.    Appearance: He is well-developed.  Cardiovascular:     Rate and Rhythm: Normal rate and regular rhythm.     Heart sounds: Normal heart sounds.  Pulmonary:     Effort: Pulmonary effort is normal.     Breath sounds: Normal breath sounds.  Musculoskeletal:     Comments: Right foot with mild/moderate edema located on the dorsum of the right foot. No crepitus or deformity. Pulses and sensation are intact and appropriate.    Skin:    General: Skin is warm and dry.  Neurological:     Mental Status: He is alert.  Psychiatric:        Mood and Affect: Mood normal.       Assessment & Plan:   Problem List Items Addressed This Visit      Other   Foot pain, right - Primary  Russell Warren continues to have pain in his right foot and is one month s/p treatment with 6 weeks of broad spectrum antibiotics. Based on his current clinical presentation it is not consistent with osteomyelitis and I have concern for other underlying orthopedic process. I agree with Dr. Laverta BaltimoreHewitt's note that he may need additional MRI imaging. I will check his inflammatory markers today. He does not need antibiotics at this time. I recommend continued follow up with orthopedics for further assessment.       Relevant Orders   C-reactive protein   Sedimentation rate       I am having Russell Warren maintain his naproxen.   Follow-up: Return if symptoms worsen or fail to improve.   Russell EkeGreg Nandita Mathenia, MSN, FNP-C Nurse Practitioner [facility] Office phone: [phone] Pager:  [phone] [facility] Main number: [phone]

## 2018-05-26 NOTE — Assessment & Plan Note (Signed)
Russell Warren continues to have pain in his right foot and is one month s/p treatment with 6 weeks of broad spectrum antibiotics. Based on his current clinical presentation it is not consistent with osteomyelitis and I have concern for other underlying orthopedic process. I agree with Dr. Laverta BaltimoreHewitt's note that he may need additional MRI imaging. I will check his inflammatory markers today. He does not need antibiotics at this time. I recommend continued follow up with orthopedics for further assessment.

## 2018-05-26 NOTE — Patient Instructions (Signed)
Nice to see you.  We will check your inflammatory markers today.  This does not appear to be infection related at present.   Recommend follow up with Dr. Victorino DikeHewitt for additional imaging.  Happy Holidays!  Happy to see you back as needed.

## 2018-05-27 LAB — SEDIMENTATION RATE: Sed Rate: 2 mm/h (ref 0–20)

## 2018-05-27 LAB — C-REACTIVE PROTEIN: CRP: 14.5 mg/L — ABNORMAL HIGH (ref <8.0)

## 2018-05-29 ENCOUNTER — Telehealth: Payer: Self-pay | Admitting: Behavioral Health

## 2018-05-29 NOTE — Telephone Encounter (Signed)
-----   Message from Veryl SpeakGregory D Calone, FNP sent at 05/29/2018  9:48 AM EST ----- Please inform Mr. Russell Warren that his inflammatory markers look good which decreases the chances that his current symptoms are related to bacterial infection. No further antibiotics are needed at this time. Would recommned continued follow up with Dr. Victorino DikeHewitt as we discussed. Happy to see him back as needed.

## 2018-05-29 NOTE — Telephone Encounter (Signed)
Called Mr. Donnie CoffinBartley Grenfell and informed him per Marcos EkeGreg Calone that his inflammatory markers look good which decreases the chances that that his current symptoms are related to bacterial infection.  He does not need further antibiotics at this time.  Per Tammy SoursGreg he should follow up with Dr. Victorino DikeHewitt as discussed.  Patient verbalized understanding. Angeline SlimAshley Hill RN

## 2018-06-12 ENCOUNTER — Ambulatory Visit (INDEPENDENT_AMBULATORY_CARE_PROVIDER_SITE_OTHER): Payer: BLUE CROSS/BLUE SHIELD | Admitting: Otolaryngology

## 2018-06-13 DIAGNOSIS — Z23 Encounter for immunization: Secondary | ICD-10-CM | POA: Diagnosis not present

## 2018-06-13 DIAGNOSIS — Z1389 Encounter for screening for other disorder: Secondary | ICD-10-CM | POA: Diagnosis not present

## 2018-06-13 DIAGNOSIS — Z6830 Body mass index (BMI) 30.0-30.9, adult: Secondary | ICD-10-CM | POA: Diagnosis not present

## 2018-06-23 ENCOUNTER — Encounter: Payer: Self-pay | Admitting: Gastroenterology

## 2018-07-17 DIAGNOSIS — J329 Chronic sinusitis, unspecified: Secondary | ICD-10-CM | POA: Diagnosis not present

## 2018-07-17 DIAGNOSIS — J069 Acute upper respiratory infection, unspecified: Secondary | ICD-10-CM | POA: Diagnosis not present

## 2018-07-17 DIAGNOSIS — J209 Acute bronchitis, unspecified: Secondary | ICD-10-CM | POA: Diagnosis not present

## 2018-07-24 DIAGNOSIS — L0101 Non-bullous impetigo: Secondary | ICD-10-CM | POA: Diagnosis not present

## 2018-07-24 DIAGNOSIS — D225 Melanocytic nevi of trunk: Secondary | ICD-10-CM | POA: Diagnosis not present

## 2018-07-24 DIAGNOSIS — D1801 Hemangioma of skin and subcutaneous tissue: Secondary | ICD-10-CM | POA: Diagnosis not present

## 2018-07-24 DIAGNOSIS — D485 Neoplasm of uncertain behavior of skin: Secondary | ICD-10-CM | POA: Diagnosis not present

## 2018-07-24 DIAGNOSIS — L570 Actinic keratosis: Secondary | ICD-10-CM | POA: Diagnosis not present

## 2018-07-24 DIAGNOSIS — L011 Impetiginization of other dermatoses: Secondary | ICD-10-CM | POA: Diagnosis not present

## 2018-07-24 DIAGNOSIS — L2089 Other atopic dermatitis: Secondary | ICD-10-CM | POA: Diagnosis not present

## 2018-07-24 DIAGNOSIS — D2262 Melanocytic nevi of left upper limb, including shoulder: Secondary | ICD-10-CM | POA: Diagnosis not present

## 2018-08-04 ENCOUNTER — Ambulatory Visit (INDEPENDENT_AMBULATORY_CARE_PROVIDER_SITE_OTHER): Payer: BLUE CROSS/BLUE SHIELD

## 2018-08-04 DIAGNOSIS — Z1211 Encounter for screening for malignant neoplasm of colon: Secondary | ICD-10-CM

## 2018-08-04 MED ORDER — NA SULFATE-K SULFATE-MG SULF 17.5-3.13-1.6 GM/177ML PO SOLN: 1.0000 | ORAL | 0 refills | Status: DC

## 2018-08-04 NOTE — Patient Instructions (Addendum)
TIELER COURNOYER  1960/06/24 MRN: [id number]     Procedure Date: 02/17/2019 Time to register: 12:45 pm Place to register: [facility] Procedure Time: 1:45pm Scheduled provider: R. Garfield Cornea, MD    PREPARATION FOR COLONOSCOPY WITH SUPREP BOWEL PREP KIT  Note: Suprep Bowel Prep Kit is a split-dose (2day) regimen. Consumption of BOTH 6-ounce bottles is required for a complete prep.  Please notify us immediately if you are diabetic, take iron supplements, or if you are on Coumadin or any other blood thinners.                                                                                                                                                  2 DAYS BEFORE PROCEDURE:  DATE: 02/15/2019   DAY: Sunday Begin clear liquid diet AFTER your lunch meal. NO SOLID FOODS after this point.  1 DAY BEFORE PROCEDURE:  DATE: 02/16/2019  DAY: Monday Continue clear liquids the entire day - NO SOLID FOOD.     At 6:00pm: Complete steps 1 through 4 below, using ONE (1) 6-ounce bottle, before going to bed. Step 1:  Pour ONE (1) 6-ounce bottle of SUPREP liquid into the mixing container.  Step 2:  Add cool drinking water to the 16 ounce line on the container and mix.  Note: Dilute the solution concentrate as directed prior to use. Step 3:  DRINK ALL the liquid in the container. Step 4:  You MUST drink an additional two (2) or more 16 ounce containers of water over the next one (1) hour.   Continue clear liquids.  DAY OF PROCEDURE:   DATE: 9/15//2020   DAY: Tuesday If you take medications for your heart, blood pressure, or breathing, you may take these medications.    5 hours before your procedure at : 8:45 am Step 1:  Pour ONE (1) 6-ounce bottle of SUPREP liquid into the mixing container.  Step 2:  Add cool drinking water to the 16 ounce line on the container and mix.  Note: Dilute the solution concentrate as directed prior to use. Step 3:  DRINK ALL the liquid in the  container. Step 4:  You MUST drink an additional two (2) or more 16 ounce containers of water over the next one (1) hour. You MUST complete the final glass of water at least 3 hours before your colonoscopy.    Nothing by mouth past 10:45 am  You may take your morning medications with sip of water unless we have instructed otherwise.    Please see below for Dietary Information.  CLEAR LIQUIDS INCLUDE:  Water Jello (NOT red in color)   Ice Popsicles (NOT red in color)   Tea (sugar ok, no milk/cream) Powdered fruit flavored drinks  Coffee (sugar ok, no milk/cream) Gatorade/ Lemonade/ Kool-Aid  (NOT red in color)   Juice: apple,  white grape, white cranberry Soft drinks  Clear bullion, consomme, broth (fat free beef/chicken/vegetable)  Carbonated beverages (any kind)  Strained chicken noodle soup Hard Candy   Remember: Clear liquids are liquids that will allow you to see your fingers on the other side of a clear glass. Be sure liquids are NOT red in color, and not cloudy, but CLEAR.  DO NOT EAT OR DRINK ANY OF THE FOLLOWING:  Dairy products of any kind   Cranberry juice Tomato juice / V8 juice   Grapefruit juice Orange juice     Red grape juice  Do not eat any solid foods, including such foods as: cereal, oatmeal, yogurt, fruits, vegetables, creamed soups, eggs, bread, crackers, pureed foods in a blender, etc.   HELPFUL HINTS FOR DRINKING PREP SOLUTION:   Make sure prep is extremely cold. Mix and refrigerate the the morning of the prep. You may also put in the freezer.   You may try mixing some Crystal Light or Country Time Lemonade if you prefer. Mix in small amounts; add more if necessary.  Try drinking through a straw  Rinse mouth with water or a mouthwash between glasses, to remove after-taste.  Try sipping on a cold beverage /ice/ popsicles between glasses of prep.  Place a piece of sugar-free hard candy in mouth between glasses.  If you become nauseated, try consuming  smaller amounts, or stretch out the time between glasses. Stop for 30-60 minutes, then slowly start back drinking.     OTHER INSTRUCTIONS  You will need a responsible adult at least 57 years of age to accompany you and drive you home. This person must remain in the waiting room during your procedure. The hospital will cancel your procedure if you do not have a responsible adult with you.   1. Wear loose fitting clothing that is easily removed. 2. Leave jewelry and other valuables at home.  3. Remove all body piercing jewelry and leave at home. 4. Total time from sign-in until discharge is approximately 2-3 hours. 5. You should go home directly after your procedure and rest. You can resume normal activities the day after your procedure. 6. The day of your procedure you should not:  Drive  Make legal decisions  Operate machinery  Drink alcohol  Return to work   You may call the office (Dept: [phone]) before 5:00pm, or page the doctor on call [phone]) after 5:00pm, for further instructions, if necessary.   Insurance Information YOU WILL NEED TO CHECK WITH YOUR INSURANCE COMPANY FOR THE BENEFITS OF COVERAGE YOU HAVE FOR THIS PROCEDURE.  UNFORTUNATELY, NOT ALL INSURANCE COMPANIES HAVE BENEFITS TO COVER ALL OR PART OF THESE TYPES OF PROCEDURES.  IT IS YOUR RESPONSIBILITY TO CHECK YOUR BENEFITS, HOWEVER, WE WILL BE GLAD TO ASSIST YOU WITH ANY CODES YOUR INSURANCE COMPANY MAY NEED.    PLEASE NOTE THAT MOST INSURANCE COMPANIES WILL NOT COVER A SCREENING COLONOSCOPY FOR PEOPLE UNDER THE AGE OF 50  IF YOU HAVE BCBS INSURANCE, YOU MAY HAVE BENEFITS FOR A SCREENING COLONOSCOPY BUT IF POLYPS ARE FOUND THE DIAGNOSIS WILL CHANGE AND THEN YOU MAY HAVE A DEDUCTIBLE THAT WILL NEED TO BE MET. SO PLEASE MAKE SURE YOU CHECK YOUR BENEFITS FOR A SCREENING COLONOSCOPY AS WELL AS A DIAGNOSTIC COLONOSCOPY.

## 2018-08-04 NOTE — Progress Notes (Signed)
Gastroenterology Pre-Procedure Review  Request Date:08/04/18 Requesting Physician: Terie Purser PA [place] no previous tcs  PATIENT REVIEW QUESTIONS: The patient responded to the following health history questions as indicated:    1. Diabetes Melitis: no 2. Joint replacements in the past 12 months: no 3. Major health problems in the past 3 months: no 4. Has an artificial valve or MVP: no 5. Has a defibrillator: no 6. Has been advised in past to take antibiotics in advance of a procedure like teeth cleaning: no 7. Family history of colon cancer: yes (brother age 45)  28. Alcohol Use: no 9. History of sleep apnea: no  10. History of coronary artery or other vascular stents placed within the last 12 months: no 11. History of any prior anesthesia complications: no    MEDICATIONS & ALLERGIES:    Patient reports the following regarding taking any blood thinners:   Plavix? no Aspirin? no Coumadin? no Brilinta? no Xarelto? no Eliquis? no Pradaxa? no Savaysa? no Effient? no  Patient confirms/reports the following medications:  Current Outpatient Medications  Medication Sig Dispense Refill  . doxycycline (VIBRA-TABS) 100 MG tablet 2 (two) times daily.     No current facility-administered medications for this visit.     Patient confirms/reports the following allergies:  Allergies  Allergen Reactions  . Latex Rash    No orders of the defined types were placed in this encounter.   AUTHORIZATION INFORMATION Primary Insurance: [place],  [place] #: [id number] Pre-Cert / Berkley Harvey required: no  SCHEDULE INFORMATION: Procedure has been scheduled as follows:  Date: 10/03/18, Time: 12:15 Location: [facility] Dr[facility]  This Gastroenterology Pre-Precedure Review Form is being routed to the following provider(s): Lewie Loron NP

## 2018-08-05 NOTE — Progress Notes (Signed)
Appropriate.

## 2018-08-11 NOTE — Addendum Note (Signed)
Addended by: Noreene Larsson on: 08/11/2018 09:00 AM   Modules accepted: Orders, SmartSet

## 2018-09-16 ENCOUNTER — Telehealth: Payer: Self-pay | Admitting: *Deleted

## 2018-09-16 NOTE — Telephone Encounter (Signed)
Lmom on pt's cell phone for pt to call us back.  Called home phone number and left message with a lady.  Will need to re-schedule pt's procedure.

## 2018-09-17 NOTE — Progress Notes (Signed)
Pt's procedure was re-scheduled to 01/02/2019 due to COVID 19.  Pt aware that we are mailing out new instructions.  Endo notified.

## 2018-10-09 ENCOUNTER — Encounter (HOSPITAL_COMMUNITY): Payer: Self-pay | Admitting: Emergency Medicine

## 2018-10-09 ENCOUNTER — Emergency Department (HOSPITAL_COMMUNITY): Payer: HRSA Program

## 2018-10-09 ENCOUNTER — Other Ambulatory Visit: Payer: Self-pay

## 2018-10-09 ENCOUNTER — Emergency Department (HOSPITAL_COMMUNITY)
Admission: EM | Admit: 2018-10-09 | Discharge: 2018-10-09 | Disposition: A | Discharge status: Home / Self Care | Payer: HRSA Program | Attending: Emergency Medicine | Admitting: Emergency Medicine

## 2018-10-09 DIAGNOSIS — R070 Pain in throat: Secondary | ICD-10-CM | POA: Diagnosis present

## 2018-10-09 DIAGNOSIS — R51 Headache: Secondary | ICD-10-CM | POA: Diagnosis not present

## 2018-10-09 DIAGNOSIS — R6889 Other general symptoms and signs: Principal | ICD-10-CM

## 2018-10-09 DIAGNOSIS — Z20828 Contact with and (suspected) exposure to other viral communicable diseases: Secondary | ICD-10-CM | POA: Insufficient documentation

## 2018-10-09 DIAGNOSIS — Z20822 Contact with and (suspected) exposure to covid-19: Secondary | ICD-10-CM

## 2018-10-09 NOTE — Discharge Instructions (Addendum)
If you develop chest pain, shortness of breath, fever, worsening headache, weakness present to emergency department for evaluation.  Your COVID test will take 24 to 48 hours to return.  You will be called with the results of this test.  X-ray was negative.

## 2018-10-09 NOTE — ED Triage Notes (Signed)
Patient c/o intermittent headache x1 week and sore throat x3 days. Reports boss was COVID positive. Denies N/V.

## 2018-10-09 NOTE — ED Provider Notes (Signed)
[facility] DEPT Provider Note   CSN: [id number] Arrival date & time: 10/09/18  1735  History   Chief Complaint Chief Complaint  Patient presents with   Sore Throat   Headache    HPI Russell Warren is a 58 y.o. male with past medical history significant for meningitis as a child, asthma who presents for evaluation of COVID testing.  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  He has been in close contact with his coworker for the last 2 weeks.  Patient called his PCP today who told to proceed to the emergency department for evaluation.  Patient states he did have a headache for 3 days approximately 10 days ago as well as sore throat last week.  States he took Tylenol and Ibuprofen for the HA and sore throat and his  Symptoms resolved. Patient states he also had generalized body aches and pains and nonproductive cough. Patient states all of his symptoms resolved approximately 4 days ago however he is concerned for COVID given his exposure. Patient states he is been able to tolerate p.o. intake without difficulty.  Denies fever, chills, nausea, vomiting, facial asymmetry, slurred speech, unilateral weakness, sudden onset thunderclap headache, neck pain, neck stiffness, drooling, dysphasia, trismus, phonation changes, chest pain, shortness of breath, abdominal pain, diarrhea, dysuria.  Patient states he is here to "just be tested to make sure I do not have it."  Of note, patient significant other is also here to be tested for COVID. Has not taken anything in the last 4 days for his symptoms. Denies any additional aggravating or alleviating factors. States he did have a left ear infection last week and was seen by Dr. Benjamine Mola who prescribed Ciprodex. Has been using these antibiotics with resolve of his left ear pain and drainage.  History obtained from patient.  No interpreter was used.     HPI  Past Medical History:    Diagnosis Date   Eczema    History of mumps    History of scarlet fever    HOH (hard of hearing)    Meningitis spinal    history of twice age 30 & 39    Patient Active Problem List   Diagnosis Date Noted   Osteomyelitis ([place]) 02/26/2018   Foot pain, right 02/26/2018   Osteomyelitis of right foot ([place]) 02/26/2018    Past Surgical History:  Procedure Laterality Date   BACK SURGERY  1999   DENTAL SURGERY     MIDDLE EAR SURGERY Bilateral         Home Medications    Prior to Admission medications   Medication Sig Start Date End Date Taking? Authorizing Provider  doxycycline (VIBRA-TABS) 100 MG tablet 2 (two) times daily. 07/29/18   [provider]  Na Sulfate-K Sulfate-Mg Sulf (SUPREP BOWEL PREP KIT) 17.5-3.13-1.6 GM/177ML SOLN Take 1 kit by mouth as directed. 08/04/18   Annitta Needs, NP    Family History Family History  Problem Relation Age of Onset   Heart disease Father    Diabetes Brother     Social History Social History   Tobacco Use   Smoking status: Never Smoker   Smokeless tobacco: Never Used  Substance Use Topics   Alcohol use: No   Drug use: Yes    Frequency: 4.0 times per week    Types: Marijuana    Comment: this afternoon     Allergies   Latex   Review of Systems  Review of Systems  Constitutional: Negative.   HENT: Positive for ear discharge, ear pain and sore throat. Negative for congestion, dental problem, drooling, facial swelling, hearing loss, mouth sores, nosebleeds, postnasal drip, rhinorrhea, sinus pressure, sinus pain, sneezing, tinnitus, trouble swallowing and voice change.   Eyes: Negative.   Respiratory: Positive for cough. Negative for apnea, choking, chest tightness, shortness of breath, wheezing and stridor.   Cardiovascular: Negative.   Gastrointestinal: Negative.   Genitourinary: Negative.   Musculoskeletal: Negative.   Skin: Negative.   Neurological: Positive for headaches. Negative for  dizziness, tremors, seizures, syncope, facial asymmetry, speech difficulty, weakness, light-headedness and numbness.  All other systems reviewed and are negative.  Physical Exam Updated Vital Signs BP (!) 123/91    Pulse 85    Temp 99.3 F (37.4 C) (Oral)    Resp 18    SpO2 97%   Physical Exam Vitals signs and nursing note reviewed.  Constitutional:      General: He is not in acute distress.    Appearance: He is not ill-appearing, toxic-appearing or diaphoretic.  HENT:     Head: Normocephalic and atraumatic.     Jaw: There is normal jaw occlusion.     Right Ear: Tympanic membrane, ear canal and external ear normal. No drainage, swelling or tenderness. No middle ear effusion. There is no impacted cerumen. No hemotympanum. Tympanic membrane is not injected, scarred, perforated, erythematous, retracted or bulging.     Left Ear: Tympanic membrane, ear canal and external ear normal. No drainage, swelling or tenderness.  No middle ear effusion. There is no impacted cerumen. No hemotympanum. Tympanic membrane is not injected, scarred, perforated, erythematous, retracted or bulging.     Ears:     Comments: No Mastoid tenderness.    Nose: No congestion or rhinorrhea.     Comments: No rhinorrhea or congestion to bilateral nares.  No sinus tenderness.    Mouth/Throat:     Comments: Posterior oropharynx clear.  Mucous membranes moist.  Tonsils without erythema or exudate.  Uvula midline without deviation.  No evidence of PTA or RPA.  No drooling, dysphasia or trismus.  Phonation normal. Eyes:     Comments: No horizontal, vertical or rotational nystagmus   Neck:     Trachea: Trachea and phonation normal.     Meningeal: Brudzinski's sign and Kernig's sign absent.     Comments:  No cervical lymphadenopathy.  Full active and passive ROM without pain No midline or paraspinal tenderness No nuchal rigidity or meningeal signs  Cardiovascular:     Comments: No murmurs rubs or gallops. Pulmonary:      Comments: Clear to auscultation bilaterally without wheeze, rhonchi or rales.  No accessory muscle usage.  Able speak in full sentences. Abdominal:     Comments: Soft, nontender without rebound or guarding.  No CVA tenderness.  Musculoskeletal:     Comments: Moves all 4 extremities without difficulty.  Lower extremities without edema, erythema or warmth.  Skin:    Comments: Brisk capillary refill.  No rashes or lesions.  Neurological:     Mental Status: He is alert.     Comments: Ambulatory in department without difficulty.   Mental Status:  Alert, oriented, thought content appropriate. Speech fluent without evidence of aphasia. Able to follow 2 step commands without difficulty.  Cranial Nerves:  II:  Peripheral visual fields grossly normal, pupils equal, round, reactive to light III,IV, VI: ptosis not present, extra-ocular motions intact bilaterally  V,VII: smile symmetric, facial light touch sensation equal  VIII: hearing grossly normal bilaterally  IX,X: midline uvula rise  XI: bilateral shoulder shrug equal and strong XII: midline tongue extension  Motor:  5/5 in upper and lower extremities bilaterally including strong and equal grip strength and dorsiflexion/plantar flexion Sensory: Pinprick and light touch normal in all extremities.  Deep Tendon Reflexes: 2+ and symmetric  Cerebellar: normal finger-to-nose with bilateral upper extremities Gait: normal gait and balance CV: distal pulses palpable throughout      ED Treatments / Results  Labs (all labs ordered are listed, but only abnormal results are displayed) Labs Reviewed  NOVEL CORONAVIRUS, NAA (HOSPITAL ORDER, SEND-OUT TO REF LAB)   EKG None  Radiology Dg Chest Portable 1 View  Result Date: 10/09/2018 CLINICAL DATA:  Cough. EXAM: PORTABLE CHEST 1 VIEW COMPARISON:  None. FINDINGS: The heart size and mediastinal contours are within normal limits. Both lungs are clear. The visualized skeletal structures are unremarkable.  IMPRESSION: No active disease. Electronically Signed   By: Marijo Conception M.D.   On: 10/09/2018 18:49    Procedures Procedures (including critical care time)  Medications Ordered in ED Medications - No data to display  Initial Impression / Assessment and Plan / ED Course  I have reviewed the triage vital signs and the nursing notes.  Pertinent labs & imaging results that were available during my care of the patient were reviewed by me and considered in my medical decision making (see chart for details).  58 year old who appears otherwise well presents for evaluation for possible COVID 19 infection. Afebrile, non septic, non ill appearing.  Patient with close contact with coworker tested positive for COVID today.  Patient has had intermittent headaches, sore throat, body aches and pains, nonproductive cough, ear pain over the last 10 days.  Symptoms resolved 4 days ago.  He is tolerating p.o. intake at home.  Lungs clear to auscultation without wheeze, rhonchi or rales.  No tachypnea, tachycardia or hypoxia, low suspicion for pneumonia.  Chest x-ray without evidence of infiltrates, cardiomegaly, pulmonary edema, pneumothorax.  Low suspicion for bacterial infection or PE as cause of his cough.  Has had sore throat approximately 10 days ago.  Posterior oropharynx clear.  Tonsils without erythema or exudate.  No evidence of PTA or RPA.  Sublingual area as well as submandibular area soft. Tolerating p.o. intake without difficulty at home.  No drooling, dysphasia or trismus. Low suspicion for strep pharyngitis as cause of sore throat. Headache 10 days ago which resolved 1 week ago.  Nonfocal neurologic exam without neurologic deficits.  No sudden onset thunderclap HA. HA non concerning for [facility], ICH or temporal arteritis. No neck stiffness or neck rigidity, low suspicion for meningitis.  Also left ear pain been taking Ciprodex x1 week, followed by Dr. Benjamine Mola.  Bilateral TMs clear without evidence of otitis.   No tenderness over mastoid bilaterally.  Patient tested for coronavirus with send out test.  Will result in 24 to 48 hours.  Discussed home isolation if symptoms worsen or test returns positive. Patient is hemodynamically stable and in no acute distress.  Patient able to ambulate in department prior to ED.  Evaluation does not show acute pathology that would require ongoing or additional emergent interventions while in the emergency department or further inpatient treatment.  I have discussed the diagnosis with the patient and answered all questions. Patient has no further complaints prior to discharge.  Patient is comfortable with plan discussed in room and is stable for discharge at this time.  I have discussed strict return precautions for returning to the emergency department.  Patient was encouraged to follow-up with PCP/specialist refer to at discharge.     Final Clinical Impressions(s) / ED Diagnoses   Final diagnoses:  Suspected Covid-19 Virus Infection    ED Discharge Orders    None       Davyn Morandi A, PA-C 10/09/18 1938    Lacretia Leigh, MD 10/14/18 1237

## 2018-10-10 LAB — NOVEL CORONAVIRUS, NAA (HOSP ORDER, SEND-OUT TO REF LAB; TAT 18-24 HRS): SARS-CoV-2, NAA: NOT DETECTED

## 2018-12-02 ENCOUNTER — Telehealth: Payer: Self-pay | Admitting: *Deleted

## 2018-12-02 NOTE — Telephone Encounter (Signed)
Left a message with a lady for pt to call us back.  Need to schedule COVID 19 screening.

## 2018-12-03 ENCOUNTER — Telehealth: Payer: Self-pay | Admitting: *Deleted

## 2018-12-03 NOTE — Telephone Encounter (Signed)
Called pt again to schedule COVID 19 screening.  Had to leave a message with his daughter for him to call us back.

## 2018-12-03 NOTE — Telephone Encounter (Signed)
Pt is scheduled for his COVID 19 screening on 12/30/2018.  Pt is aware to remain In quarantine once testing is done.  Pt voiced understanding.

## 2018-12-29 ENCOUNTER — Telehealth: Payer: Self-pay | Admitting: Internal Medicine

## 2018-12-29 NOTE — Telephone Encounter (Signed)
Pt wants to reschedule his procedure with [facility] on 7/31 to 3 weeks out due to finance reasons. Please call him at [phone]

## 2018-12-29 NOTE — Telephone Encounter (Signed)
Left a voicemail for pt to call us back.

## 2018-12-30 ENCOUNTER — Other Ambulatory Visit: Payer: Self-pay

## 2018-12-30 ENCOUNTER — Other Ambulatory Visit (HOSPITAL_COMMUNITY)
Admission: RE | Admit: 2018-12-30 | Discharge: 2018-12-30 | Disposition: A | Discharge status: Home / Self Care | Payer: Self-pay | Source: Ambulatory Visit | Attending: Internal Medicine | Admitting: Internal Medicine

## 2018-12-30 NOTE — Progress Notes (Addendum)
Pt decided to re-schedule his procedure to 02/17/2019.  Pt is aware of his new COVID screening appointment on 02/13/2019 as well.  Pt was reminded to remain in quarantine once testing is done.  Pt is aware that we are mailing out new instructions. Endo notified.

## 2018-12-30 NOTE — Telephone Encounter (Signed)
Called pt and he re-scheduled to 02/17/2019.  Pt is aware that we are mailing out new prep instructions.  He is also aware of new COVID screening appt on 02/13/2019.  Endo notified.

## 2019-02-10 ENCOUNTER — Telehealth: Payer: Self-pay | Admitting: Internal Medicine

## 2019-02-10 NOTE — Telephone Encounter (Signed)
Pt was laid off his job and wants to cancel his procedure

## 2019-02-10 NOTE — Telephone Encounter (Signed)
Lmom for pt to call me back. 

## 2019-02-11 NOTE — Telephone Encounter (Signed)
Spoke with pt and he wants to cancel his procedure and did not wish to reschedule.  He is trying to find another job and doesn't have any idea when he will have insurance.  Endo notified.

## 2019-02-13 ENCOUNTER — Other Ambulatory Visit (HOSPITAL_COMMUNITY)
Admission: RE | Admit: 2019-02-13 | Discharge: 2019-02-13 | Disposition: A | Discharge status: Home / Self Care | Payer: 59 | Source: Ambulatory Visit | Attending: Internal Medicine | Admitting: Internal Medicine

## 2019-02-13 ENCOUNTER — Other Ambulatory Visit: Payer: Self-pay

## 2019-02-17 ENCOUNTER — Ambulatory Visit (HOSPITAL_COMMUNITY): Admission: RE | Admit: 2019-02-17 | Payer: 59 | Source: Home / Self Care | Admitting: Internal Medicine

## 2019-02-17 ENCOUNTER — Encounter (HOSPITAL_COMMUNITY): Admission: RE | Payer: Self-pay | Source: Home / Self Care

## 2019-02-17 SURGERY — COLONOSCOPY
Anesthesia: Moderate Sedation

## 2019-04-05 ENCOUNTER — Other Ambulatory Visit: Payer: Self-pay

## 2019-04-05 DIAGNOSIS — Z733 Stress, not elsewhere classified: Secondary | ICD-10-CM | POA: Insufficient documentation

## 2019-04-05 DIAGNOSIS — Z79899 Other long term (current) drug therapy: Secondary | ICD-10-CM | POA: Insufficient documentation

## 2019-04-05 DIAGNOSIS — R0600 Dyspnea, unspecified: Secondary | ICD-10-CM | POA: Insufficient documentation

## 2019-04-05 DIAGNOSIS — F419 Anxiety disorder, unspecified: Secondary | ICD-10-CM | POA: Insufficient documentation

## 2019-04-05 NOTE — ED Triage Notes (Signed)
Patient states shortness of breath when is is sitting or lying still. Patient states he is not short of breath when he is up moving around. Patient states he has woke up twice feeling anxious and feeling like he could not get his breath. Patient states symptoms having been ongoing x 2 weeks.

## 2019-04-06 ENCOUNTER — Emergency Department (HOSPITAL_COMMUNITY): Payer: Self-pay

## 2019-04-06 ENCOUNTER — Emergency Department (HOSPITAL_COMMUNITY)
Admission: EM | Admit: 2019-04-06 | Discharge: 2019-04-06 | Disposition: A | Discharge status: Home / Self Care | Payer: Self-pay | Attending: Emergency Medicine | Admitting: Emergency Medicine

## 2019-04-06 DIAGNOSIS — R0600 Dyspnea, unspecified: Secondary | ICD-10-CM

## 2019-04-06 DIAGNOSIS — F419 Anxiety disorder, unspecified: Secondary | ICD-10-CM

## 2019-04-06 LAB — BASIC METABOLIC PANEL
Anion gap: 7 (ref 5–15)
BUN: 22 mg/dL — ABNORMAL HIGH (ref 6–20)
CO2: 23 mmol/L (ref 22–32)
Calcium: 9 mg/dL (ref 8.9–10.3)
Chloride: 105 mmol/L (ref 98–111)
Creatinine, Ser: 0.96 mg/dL (ref 0.61–1.24)
GFR calc Af Amer: >60 mL/min (ref >60)
GFR calc non Af Amer: >60 mL/min (ref >60)
Glucose, Bld: 101 mg/dL — ABNORMAL HIGH (ref 70–99)
Potassium: 3.9 mmol/L (ref 3.5–5.1)
Sodium: 135 mmol/L (ref 135–145)

## 2019-04-06 LAB — CBC
HCT: 45.6 % (ref 39.0–52.0)
Hemoglobin: 15.1 g/dL (ref 13.0–17.0)
MCH: 30.4 pg (ref 26.0–34.0)
MCHC: 33.1 g/dL (ref 30.0–36.0)
MCV: 91.8 fL (ref 80.0–100.0)
Platelets: 208 10*3/uL (ref 150–400)
RBC: 4.97 MIL/uL (ref 4.22–5.81)
RDW: 12.6 % (ref 11.5–15.5)
WBC: 8.8 10*3/uL (ref 4.0–10.5)
nRBC: 0 % (ref 0.0–0.2)

## 2019-04-06 LAB — TROPONIN I (HIGH SENSITIVITY): Troponin I (High Sensitivity): 2 ng/L (ref <18)

## 2019-04-06 MED ORDER — ALPRAZOLAM 0.5 MG PO TABS: 0.5000 mg | ORAL_TABLET | Freq: Every evening | ORAL | 0 refills | Status: DC | PRN

## 2019-04-06 NOTE — ED Provider Notes (Signed)
[facility] EMERGENCY DEPARTMENT Provider Note   CSN: [id number] Arrival date & time: 04/05/19  2239     History   Chief Complaint Chief Complaint  Patient presents with  . Shortness of Breath    HPI Russell Warren is a 58 y.o. male.     Patient is a 58 year old male with history of osteomyelitis of the right foot.  He presents today for evaluation of shortness of breath.  Patient reports at night when he is asleep feeling as if he "forgets to breathe".  He then wakes up gasping for air.  This is happened several evenings over the past 2 weeks.  Patient states during the day when he is up and around and exerting himself he feels fine and has no symptoms.  This only occurs when he lays down to sleep.  He denies any swelling in his feet or legs.  He denies any fevers, chills, or cough.  Patient does state he has been under an extensive amount of stress recently related to work, finances, and raising a pair of young twins.  The history is provided by the patient.  Shortness of Breath Severity:  Moderate Duration:  2 weeks Timing:  Intermittent Progression:  Worsening Chronicity:  New Relieved by:  Nothing Worsened by:  Nothing Ineffective treatments:  None tried   Past Medical History:  Diagnosis Date  . Eczema   . History of mumps   . History of scarlet fever   . HOH (hard of hearing)   . Meningitis spinal    history of twice age 58 & 45    Patient Active Problem List   Diagnosis Date Noted  . Osteomyelitis ([place]) 02/26/2018  . Foot pain, right 02/26/2018  . Osteomyelitis of right foot ([place]) 02/26/2018    Past Surgical History:  Procedure Laterality Date  . BACK SURGERY  1999  . DENTAL SURGERY    . MIDDLE EAR SURGERY Bilateral         Home Medications    Prior to Admission medications   Medication Sig Start Date End Date Taking? Authorizing Provider  doxycycline (VIBRA-TABS) 100 MG tablet 2 (two) times daily. 07/29/18   [provider]  Na  Sulfate-K Sulfate-Mg Sulf (SUPREP BOWEL PREP KIT) 17.5-3.13-1.6 GM/177ML SOLN Take 1 kit by mouth as directed. 08/04/18   Annitta Needs, NP    Family History Family History  Problem Relation Age of Onset  . Heart disease Father   . Diabetes Brother     Social History Social History   Tobacco Use  . Smoking status: Never Smoker  . Smokeless tobacco: Never Used  Substance Use Topics  . Alcohol use: No  . Drug use: Yes    Frequency: 4.0 times per week    Types: Marijuana    Comment: this afternoon     Allergies   Latex   Review of Systems Review of Systems  Respiratory: Positive for shortness of breath.   All other systems reviewed and are negative.    Physical Exam Updated Vital Signs BP (!) 129/99   Pulse 77   Temp 97.6 F (36.4 C) (Oral)   Resp 18   Ht 5' 11.5" (1.816 m)   Wt 99.8 kg   SpO2 98%   BMI 30.26 kg/m   Physical Exam Vitals signs and nursing note reviewed.  Constitutional:      General: He is not in acute distress.    Appearance: He is well-developed. He is not diaphoretic.  HENT:  Head: Normocephalic and atraumatic.  Neck:     Musculoskeletal: Normal range of motion and neck supple.  Cardiovascular:     Rate and Rhythm: Normal rate and regular rhythm.     Heart sounds: No murmur. No friction rub.  Pulmonary:     Effort: Pulmonary effort is normal. No respiratory distress.     Breath sounds: Normal breath sounds. No wheezing or rales.  Abdominal:     General: Bowel sounds are normal. There is no distension.     Palpations: Abdomen is soft.     Tenderness: There is no abdominal tenderness.  Musculoskeletal: Normal range of motion.     Right lower leg: He exhibits no tenderness. No edema.     Left lower leg: He exhibits no tenderness. No edema.  Skin:    General: Skin is warm and dry.  Neurological:     Mental Status: He is alert and oriented to person, place, and time.     Coordination: Coordination normal.      ED Treatments /  Results  Labs (all labs ordered are listed, but only abnormal results are displayed) Labs Reviewed  CBC  BASIC METABOLIC PANEL  TROPONIN I (HIGH SENSITIVITY)    EKG EKG Interpretation  Date/Time:  Monday April 06 2019 00:45:46 EST Ventricular Rate:  61 PR Interval:    QRS Duration: 108 QT Interval:  409 QTC Calculation: 412 R Axis:   95 Text Interpretation: Sinus rhythm Early Repolarization Confirmed by Veryl Speak [phone]) on 04/06/2019 1:20:17 AM   Radiology No results found.  Procedures Procedures (including critical care time)  Medications Ordered in ED Medications - No data to display   Initial Impression / Assessment and Plan / ED Course  I have reviewed the triage vital signs and the nursing notes.  Pertinent labs & imaging results that were available during my care of the patient were reviewed by me and considered in my medical decision making (see chart for details).  Patient presents here with complaints of shortness of breath and feeling as if he can't catch his breath.  This seems to occur only at night when he attempts to sleep.  He describes this as if he "forgets to take of breath, then wakes up gasping".  This does not occur when he is exerting himself or awake throughout the day.  His symptoms are highly atypical for cardiac ischemia.  His EKG is unchanged and troponin is negative and I feel as though no further work-up into the cardiac aspect is negative.  His oxygen saturations are upper 90s and he is in no respiratory distress.  I highly doubt PE.  At the top of the differential are anxiety as he describes an excessive amount of stress in his life recently, and possibly sleep apnea.  Patient will be prescribed a low-dose of Xanax which he can take at night.  If patient not improving, he may need to follow-up with his primary doctor to discuss a sleep study.  Final Clinical Impressions(s) / ED Diagnoses   Final diagnoses:  None    ED Discharge  Orders    None       Veryl Speak, MD 04/06/19 0150

## 2019-04-06 NOTE — Discharge Instructions (Signed)
Begin taking Xanax as prescribed at night.  Follow-up with your primary doctor if symptoms or not improving in the next week, and return to the ER if you develop worsening breathing, high fever, severe chest pain, or other new and concerning symptoms.

## 2019-06-12 ENCOUNTER — Other Ambulatory Visit: Payer: Self-pay

## 2019-06-12 ENCOUNTER — Emergency Department (HOSPITAL_COMMUNITY)
Admission: EM | Admit: 2019-06-12 | Discharge: 2019-06-12 | Disposition: A | Discharge status: Home / Self Care | Payer: Self-pay | Attending: Emergency Medicine | Admitting: Emergency Medicine

## 2019-06-12 ENCOUNTER — Encounter (HOSPITAL_COMMUNITY): Payer: Self-pay | Admitting: Emergency Medicine

## 2019-06-12 DIAGNOSIS — Y929 Unspecified place or not applicable: Secondary | ICD-10-CM | POA: Insufficient documentation

## 2019-06-12 DIAGNOSIS — Y999 Unspecified external cause status: Secondary | ICD-10-CM | POA: Insufficient documentation

## 2019-06-12 DIAGNOSIS — Y9389 Activity, other specified: Secondary | ICD-10-CM | POA: Insufficient documentation

## 2019-06-12 DIAGNOSIS — S79822A Other specified injuries of left thigh, initial encounter: Secondary | ICD-10-CM | POA: Insufficient documentation

## 2019-06-12 DIAGNOSIS — Z23 Encounter for immunization: Secondary | ICD-10-CM | POA: Insufficient documentation

## 2019-06-12 DIAGNOSIS — W540XXA Bitten by dog, initial encounter: Secondary | ICD-10-CM | POA: Insufficient documentation

## 2019-06-12 MED ORDER — OXYCODONE-ACETAMINOPHEN 5-325 MG PO TABS: 1.0000 | ORAL_TABLET | Freq: Three times a day (TID) | ORAL | 0 refills | Status: DC | PRN

## 2019-06-12 MED ORDER — TETANUS-DIPHTH-ACELL PERTUSSIS 5-2.5-18.5 LF-MCG/0.5 IM SUSP
0.5000 mL | Freq: Once | INTRAMUSCULAR | Status: AC
  Administered 2019-06-12: 0.5 mL via INTRAMUSCULAR
  Filled 2019-06-12: qty 0.5

## 2019-06-12 MED ORDER — AMOXICILLIN-POT CLAVULANATE 875-125 MG PO TABS
1.0000 | ORAL_TABLET | Freq: Once | ORAL | Status: AC
  Administered 2019-06-12: 1 via ORAL
  Filled 2019-06-12: qty 1

## 2019-06-12 MED ORDER — OXYCODONE-ACETAMINOPHEN 5-325 MG PO TABS
2.0000 | ORAL_TABLET | Freq: Once | ORAL | Status: AC
  Administered 2019-06-12: 2 via ORAL
  Filled 2019-06-12: qty 2

## 2019-06-12 MED ORDER — AMOXICILLIN-POT CLAVULANATE 875-125 MG PO TABS: 1.0000 | ORAL_TABLET | Freq: Two times a day (BID) | ORAL | 0 refills | Status: DC

## 2019-06-12 NOTE — ED Provider Notes (Signed)
Emergency Department Provider Note   I have reviewed the triage vital signs and the nursing notes.   HISTORY  Chief Complaint Animal Bite   HPI Russell Warren is a 59 y.o. male with medical problems documented below who presents the emerge department today approximate 5 days after dog bite.  Patient states he has a park call the/pitbull who attacked him because he was arguing with his wife.  Bit him on the left inner thigh.  Has large amount of bruising and scratches there.  Unknown tetanus shot.  Patient and was been acting normally since then.  Patient without any fevers but just has pain in his leg that does not seem to be improving so presents here for evaluation.  Has been using some antibiotic ointment and ice.  Tylenol and ibuprofen do not seem to be helping.  He also has developed a "fluid sac".   No other associated or modifying symptoms.    Past Medical History:  Diagnosis Date  . Eczema   . History of mumps   . History of scarlet fever   . HOH (hard of hearing)   . Meningitis spinal    history of twice age 67 & 23    Patient Active Problem List   Diagnosis Date Noted  . Osteomyelitis ([place]) 02/26/2018  . Foot pain, right 02/26/2018  . Osteomyelitis of right foot ([place]) 02/26/2018    Past Surgical History:  Procedure Laterality Date  . BACK SURGERY  1999  . DENTAL SURGERY    . MIDDLE EAR SURGERY Bilateral     Current Outpatient Rx  . Order #: [id number] Class: Normal  . Order #: [id number] Class: Print  . Order #: [id number] Class: Normal  . Order #: [id number] Class: Print    Allergies Latex  Family History  Problem Relation Age of Onset  . Heart disease Father   . Diabetes Brother     Social History Social History   Tobacco Use  . Smoking status: Never Smoker  . Smokeless tobacco: Never Used  Substance Use Topics  . Alcohol use: No  . Drug use: Not Currently    Frequency: 4.0 times per week    Types: Marijuana    Comment: this afternoon     Review of Systems  All other systems negative except as documented in the HPI. All pertinent positives and negatives as reviewed in the HPI. ____________________________________________   PHYSICAL EXAM:  VITAL SIGNS: ED Triage Vitals  Enc Vitals Group     BP 06/12/19 0610 132/88     Pulse Rate 06/12/19 0610 71     Resp 06/12/19 0610 20     Temp 06/12/19 0610 97.9 F (36.6 C)     Temp Source 06/12/19 0610 Oral     SpO2 06/12/19 0610 99 %     Weight 06/12/19 0611 220 lb (99.8 kg)     Height 06/12/19 0611 6' (1.829 m)    Constitutional: Alert and oriented. Well appearing and in no acute distress. Eyes: Conjunctivae are normal. PERRL. EOMI. Head: Atraumatic. Nose: No congestion/rhinnorhea. Mouth/Throat: Mucous membranes are moist.  Oropharynx non-erythematous. Neck: No stridor.  No meningeal signs.   Cardiovascular: Normal rate, regular rhythm. Good peripheral circulation. Grossly normal heart sounds.   Respiratory: Normal respiratory effort.  No retractions. Lungs CTAB. Gastrointestinal: Soft and nontender. No distention.  Musculoskeletal: Significant tenderness over his left medial thigh.  He has good strength there.  He has large area of old healing bruising and wounds with significant erythema around  them.  It is all tender.  When he stands up he has shift of fluid down to the more distal aspect of his upper thigh. Neurologic:  Normal speech and language. No gross focal neurologic deficits are appreciated.  Skin:  Skin is warm, dry and intact. No rash noted.   ____________________________________________   PROCEDURES  Procedure(s) performed:   Procedures   ____________________________________________   INITIAL IMPRESSION / ASSESSMENT AND PLAN / ED COURSE  Suspect significant soft tissue injury from the bite.  Seems to be healing appropriately he states the swelling seems to be get better but the pain just did not get better.  Will start on antibiotics for  possible superinfection of the wounds.  We will also give prescription for some short course of pain medicine.  Tetanus updated.  Low suspicion for abscess or deep-seated infection at this time.  I think it is all just inflammatory.  There is no induration or other concerns for that this time.  He will return here if it worsens but otherwise follow with his doctor in 2 to 3 days.     Pertinent labs & imaging results that were available during my care of the patient were reviewed by me and considered in my medical decision making (see chart for details).  A medical screening exam was performed and I feel the patient has had an appropriate workup for their chief complaint at this time and likelihood of emergent condition existing is low. They have been counseled on decision, discharge, follow up and which symptoms necessitate immediate return to the emergency department. They or their family verbally stated understanding and agreement with plan and discharged in stable condition.   ____________________________________________  FINAL CLINICAL IMPRESSION(S) / ED DIAGNOSES  Final diagnoses:  Dog bite, initial encounter     MEDICATIONS GIVEN DURING THIS VISIT:  Medications  oxyCODONE-acetaminophen (PERCOCET/ROXICET) 5-325 MG per tablet 2 tablet (2 tablets Oral Given 06/12/19 0634)  amoxicillin-clavulanate (AUGMENTIN) 875-125 MG per tablet 1 tablet (1 tablet Oral Given 06/12/19 0634)  Tdap (BOOSTRIX) injection 0.5 mL (0.5 mLs Intramuscular Given 06/12/19 0634)     NEW OUTPATIENT MEDICATIONS STARTED DURING THIS VISIT:  New Prescriptions   AMOXICILLIN-CLAVULANATE (AUGMENTIN) 875-125 MG TABLET    Take 1 tablet by mouth 2 (two) times daily. One po bid x 7 days   OXYCODONE-ACETAMINOPHEN (PERCOCET) 5-325 MG TABLET    Take 1 tablet by mouth every 8 (eight) hours as needed for severe pain.    Note:  This note was prepared with assistance of Dragon voice recognition software. Occasional wrong-word or  sound-a-like substitutions may have occurred due to the inherent limitations of voice recognition software.   Juelz Whittenberg, Barbara Cower, MD 06/12/19 [phone]

## 2019-06-12 NOTE — ED Triage Notes (Signed)
Pt states he was bit by his dog on 06/07/19. States he was waiting to come in but now it is red.

## 2019-06-16 ENCOUNTER — Ambulatory Visit
Admission: EM | Admit: 2019-06-16 | Discharge: 2019-06-16 | Disposition: A | Discharge status: Home / Self Care | Payer: Self-pay

## 2019-06-16 DIAGNOSIS — S71152S Open bite, left thigh, sequela: Secondary | ICD-10-CM

## 2019-06-16 DIAGNOSIS — W540XXS Bitten by dog, sequela: Secondary | ICD-10-CM

## 2019-06-16 NOTE — Discharge Instructions (Addendum)
Patient was advised that the fluid sac will be reabsorbed To watch for signs of infection Wound is healing appropriately Advised patient to take antibiotic as prescribed and to completion To return for worsening of symptoms

## 2019-06-16 NOTE — ED Triage Notes (Signed)
Pt here for wound recheck after dog bite on the 3rd, bites are healed but area id induration is painful, provider made aware

## 2019-06-16 NOTE — ED Provider Notes (Signed)
[facility]    CSN: [id number] Arrival date & time: 06/16/19  1915      History   Chief Complaint No chief complaint on file.   HPI TRACE WIRICK is a 59 y.o. male.   Milana Na 59 years old male presented to the urgent care with a complaint of wound check.  Report he was bite by his dog on June 07, 2019.  Injury is located to the left thigh patient was seen in the ED and was treated with with antibiotic.  He also developed a fluid set around the wound.     Past Medical History:  Diagnosis Date  . Eczema   . History of mumps   . History of scarlet fever   . HOH (hard of hearing)   . Meningitis spinal    history of twice age 34 & 69    Patient Active Problem List   Diagnosis Date Noted  . Osteomyelitis ([place]) 02/26/2018  . Foot pain, right 02/26/2018  . Osteomyelitis of right foot ([place]) 02/26/2018    Past Surgical History:  Procedure Laterality Date  . BACK SURGERY  1999  . DENTAL SURGERY    . MIDDLE EAR SURGERY Bilateral        Home Medications    Prior to Admission medications   Medication Sig Start Date End Date Taking? Authorizing Provider  ALPRAZolam Duanne Moron) 0.5 MG tablet Take 1 tablet (0.5 mg total) by mouth at bedtime as needed for anxiety. 04/06/19   Veryl Speak, MD  amoxicillin-clavulanate (AUGMENTIN) 875-125 MG tablet Take 1 tablet by mouth 2 (two) times daily. One po bid x 7 days 06/12/19   Mesner, Corene Cornea, MD  Na Sulfate-K Sulfate-Mg Sulf (SUPREP BOWEL PREP KIT) 17.5-3.13-1.6 GM/177ML SOLN Take 1 kit by mouth as directed. 08/04/18   Annitta Needs, NP  oxyCODONE-acetaminophen (PERCOCET) 5-325 MG tablet Take 1 tablet by mouth every 8 (eight) hours as needed for severe pain. 06/12/19   Mesner, Corene Cornea, MD    Family History Family History  Problem Relation Age of Onset  . Heart disease Father   . Diabetes Brother     Social History Social History   Tobacco Use  . Smoking status: Never Smoker  . Smokeless tobacco: Never Used    Substance Use Topics  . Alcohol use: No  . Drug use: Not Currently    Frequency: 4.0 times per week    Types: Marijuana    Comment: this afternoon     Allergies   Latex   Review of Systems Review of Systems  Constitutional: Negative.   Respiratory: Negative.   Cardiovascular: Negative.   Skin: Positive for wound.  All other systems reviewed and are negative.    Physical Exam Triage Vital Signs ED Triage Vitals [06/16/19 1925]  Enc Vitals Group     BP      Pulse      Resp      Temp      Temp src      SpO2      Weight      Height      Head Circumference      Peak Flow      Pain Score 5     Pain Loc      Pain Edu?      Excl. in [place]?    No data found.  Updated Vital Signs There were no vitals taken for this visit.  Visual Acuity Right Eye Distance:   Left  Eye Distance:   Bilateral Distance:    Right Eye Near:   Left Eye Near:    Bilateral Near:     Physical Exam Vitals and nursing note reviewed.  Constitutional:      General: He is not in acute distress.    Appearance: Normal appearance. He is normal weight. He is not ill-appearing or toxic-appearing.  Cardiovascular:     Rate and Rhythm: Regular rhythm. Tachycardia present.     Pulses: Normal pulses.     Heart sounds: Normal heart sounds. No murmur.  Pulmonary:     Effort: Pulmonary effort is normal. No respiratory distress.     Breath sounds: Normal breath sounds. No wheezing.  Chest:     Chest wall: No tenderness.  Skin:    General: Skin is warm.     Coloration: Skin is not pale.     Findings: Signs of injury present. No bruising, erythema, lesion or rash.  Neurological:     Mental Status: He is alert.      UC Treatments / Results  Labs (all labs ordered are listed, but only abnormal results are displayed) Labs Reviewed - No data to display  EKG   Radiology No results found.  Procedures Procedures (including critical care time)  Medications Ordered in UC Medications - No  data to display  Initial Impression / Assessment and Plan / UC Course  I have reviewed the triage vital signs and the nursing notes.  Pertinent labs & imaging results that were available during my care of the patient were reviewed by me and considered in my medical decision making (see chart for details).   Patient stable for discharge.  Advised patient to take antibiotic medication as prescribed to completion.  To return or go to ED for worsening of symptoms.  Final Clinical Impressions(s) / UC Diagnoses   Final diagnoses:  Dog bite of left thigh, sequela     Discharge Instructions     Patient was advised that the fluid sac will be reabsorbed To watch for signs of infection Wound is healing appropriately Advised patient to take antibiotic as prescribed and to completion To return for worsening of symptoms    ED Prescriptions    None     PDMP not reviewed this encounter.   Emerson Monte, FNP 06/16/19 1940

## 2019-06-23 ENCOUNTER — Emergency Department (HOSPITAL_COMMUNITY)
Admission: EM | Admit: 2019-06-23 | Discharge: 2019-06-23 | Disposition: A | Discharge status: Home / Self Care | Payer: Self-pay | Attending: Emergency Medicine | Admitting: Emergency Medicine

## 2019-06-23 ENCOUNTER — Other Ambulatory Visit: Payer: Self-pay

## 2019-06-23 ENCOUNTER — Encounter (HOSPITAL_COMMUNITY): Payer: Self-pay | Admitting: Emergency Medicine

## 2019-06-23 DIAGNOSIS — Y929 Unspecified place or not applicable: Secondary | ICD-10-CM | POA: Insufficient documentation

## 2019-06-23 DIAGNOSIS — Y939 Activity, unspecified: Secondary | ICD-10-CM | POA: Insufficient documentation

## 2019-06-23 DIAGNOSIS — Y999 Unspecified external cause status: Secondary | ICD-10-CM | POA: Insufficient documentation

## 2019-06-23 DIAGNOSIS — Z79899 Other long term (current) drug therapy: Secondary | ICD-10-CM | POA: Insufficient documentation

## 2019-06-23 DIAGNOSIS — W540XXA Bitten by dog, initial encounter: Secondary | ICD-10-CM | POA: Insufficient documentation

## 2019-06-23 DIAGNOSIS — S8012XA Contusion of left lower leg, initial encounter: Secondary | ICD-10-CM | POA: Insufficient documentation

## 2019-06-23 DIAGNOSIS — Z91040 Latex allergy status: Secondary | ICD-10-CM | POA: Insufficient documentation

## 2019-06-23 NOTE — ED Triage Notes (Signed)
Pain to area on LT thigh from dog bite on 06/07/19.  Seen here on 06/16/19 for the same, given abx and pain meds.  Has one abx left and is out of pain meds

## 2019-06-23 NOTE — ED Provider Notes (Signed)
[facility] EMERGENCY DEPARTMENT Provider Note   CSN: [id number] Arrival date & time: 06/23/19  1130     History Chief Complaint  Patient presents with  . Wound Check    Russell Warren is a 59 y.o. male.  The history is provided by the patient. No language interpreter was used.  Wound Check This is a recurrent problem. The current episode started more than 1 week ago. The problem occurs constantly. The problem has been gradually worsening. Nothing aggravates the symptoms. Nothing relieves the symptoms. He has tried nothing for the symptoms. The treatment provided no relief.   Pt complains of swollen area left leg,  Pt reports his dog bit him 2 weeks ago.  Pt reports he has an area of swelling that feels like fluid that has persist.  Pt thinks he may have torn something    Past Medical History:  Diagnosis Date  . Eczema   . History of mumps   . History of scarlet fever   . HOH (hard of hearing)   . Meningitis spinal    history of twice age 34 & 42    Patient Active Problem List   Diagnosis Date Noted  . Osteomyelitis ([place]) 02/26/2018  . Foot pain, right 02/26/2018  . Osteomyelitis of right foot ([place]) 02/26/2018    Past Surgical History:  Procedure Laterality Date  . BACK SURGERY  1999  . DENTAL SURGERY    . MIDDLE EAR SURGERY Bilateral        Family History  Problem Relation Age of Onset  . Heart disease Father   . Diabetes Brother     Social History   Tobacco Use  . Smoking status: Never Smoker  . Smokeless tobacco: Never Used  Substance Use Topics  . Alcohol use: No  . Drug use: Not Currently    Frequency: 4.0 times per week    Types: Marijuana    Comment: this afternoon    Home Medications Prior to Admission medications   Medication Sig Start Date End Date Taking? Authorizing Provider  ALPRAZolam Duanne Moron) 0.5 MG tablet Take 1 tablet (0.5 mg total) by mouth at bedtime as needed for anxiety. 04/06/19   Veryl Speak, MD  amoxicillin-clavulanate  (AUGMENTIN) 875-125 MG tablet Take 1 tablet by mouth 2 (two) times daily. One po bid x 7 days 06/12/19   Mesner, Corene Cornea, MD  Na Sulfate-K Sulfate-Mg Sulf (SUPREP BOWEL PREP KIT) 17.5-3.13-1.6 GM/177ML SOLN Take 1 kit by mouth as directed. 08/04/18   Annitta Needs, NP  oxyCODONE-acetaminophen (PERCOCET) 5-325 MG tablet Take 1 tablet by mouth every 8 (eight) hours as needed for severe pain. 06/12/19   Mesner, Corene Cornea, MD    Allergies    Latex  Review of Systems   Review of Systems  All other systems reviewed and are negative.   Physical Exam Updated Vital Signs BP (!) 144/90 (BP Location: Right Arm)   Pulse 78   Temp 98.8 F (37.1 C) (Oral)   Ht '5\' 11"'  (1.803 m)   Wt 99.8 kg   SpO2 99%   BMI 30.68 kg/m   Physical Exam Vitals and nursing note reviewed.  Constitutional:      Appearance: He is well-developed.  HENT:     Head: Normocephalic and atraumatic.  Eyes:     Conjunctiva/sclera: Conjunctivae normal.  Cardiovascular:     Rate and Rhythm: Normal rate and regular rhythm.     Heart sounds: No murmur.  Pulmonary:     Effort: Pulmonary  effort is normal. No respiratory distress.     Breath sounds: Normal breath sounds.  Abdominal:     Palpations: Abdomen is soft.     Tenderness: There is no abdominal tenderness.  Musculoskeletal:     Cervical back: Neck supple.     Comments: Healing scabbed bite wounds left upper inner thigh.  12 cm swollen fluctuant area,  No redness, no edema   Skin:    General: Skin is warm and dry.  Neurological:     Mental Status: He is alert.     ED Results / Procedures / Treatments   Labs (all labs ordered are listed, but only abnormal results are displayed) Labs Reviewed - No data to display  EKG None  Radiology No results found.  Procedures Procedures (including critical care time)  Medications Ordered in ED Medications - No data to display  ED Course  I have reviewed the triage vital signs and the nursing notes.  Pertinent labs &  imaging results that were available during my care of the patient were reviewed by me and considered in my medical decision making (see chart for details).    MDM Rules/Calculators/A&P                      MDM:  I think pt has a hematoma.  I do not think area requires drainage.  Area does not appear infection. Pt advised to follow up with Dr. Aline Brochure if symptoms persist   Pt placed in an ace wrap.  Final Clinical Impression(s) / ED Diagnoses Final diagnoses:  Hematoma of left lower leg    Rx / DC Orders ED Discharge Orders    None    An After Visit Summary was printed and given to the patient.    Fransico Meadow, [place] 06/23/19 [place], MD 06/25/19 [phone]

## 2019-06-23 NOTE — Discharge Instructions (Addendum)
Keep area wrapped for the next 3 days.  See Dr. Romeo Apple if swelling persist

## 2019-06-24 ENCOUNTER — Telehealth: Payer: Self-pay | Admitting: Orthopedic Surgery

## 2019-06-24 NOTE — Telephone Encounter (Signed)
Call received from patient following 2 Emergency room visits at [facility] and also a visit to [facility], [place] for problem of dog bite to left upper leg. States he is not getting better; states has had no Xrays. Patient relays Dr Harrison's name is on his discharge paperwork.   Patient inquired about self-pay; discussed protocol.  Please review and advise  Based on status of current schedules.

## 2019-06-26 ENCOUNTER — Ambulatory Visit (INDEPENDENT_AMBULATORY_CARE_PROVIDER_SITE_OTHER): Payer: Self-pay | Admitting: Orthopedic Surgery

## 2019-06-26 ENCOUNTER — Encounter: Payer: Self-pay | Admitting: Orthopedic Surgery

## 2019-06-26 ENCOUNTER — Other Ambulatory Visit: Payer: Self-pay

## 2019-06-26 VITALS — BP 127/87 | HR 72 | Ht 71.0 in | Wt 220.0 lb

## 2019-06-26 DIAGNOSIS — T148XXA Other injury of unspecified body region, initial encounter: Secondary | ICD-10-CM

## 2019-06-26 DIAGNOSIS — W540XXA Bitten by dog, initial encounter: Secondary | ICD-10-CM

## 2019-06-26 MED ORDER — GABAPENTIN 100 MG PO CAPS: 100.0000 mg | ORAL_CAPSULE | Freq: Three times a day (TID) | ORAL | 2 refills | Status: DC

## 2019-06-26 MED ORDER — HYDROCODONE-ACETAMINOPHEN 5-325 MG PO TABS: 1.0000 | ORAL_TABLET | Freq: Four times a day (QID) | ORAL | 0 refills | Status: DC | PRN

## 2019-06-26 MED ORDER — IBUPROFEN 800 MG PO TABS: 800.0000 mg | ORAL_TABLET | Freq: Three times a day (TID) | ORAL | 0 refills | Status: DC | PRN

## 2019-06-26 NOTE — Patient Instructions (Signed)
Russell Warren the dog bite caused a lot of tissue damage on the muscles on the medial side of your thigh  I was able to drain 75 mL of blood from the pocket  Recommend you take the new medication gabapentin 100 mg 3 times a day as needed for burning and then start some ibuprofen 800 mg every 8 hours to control soreness and then for pain you can take hydrocodone 5 mg every 6 hours  Out of work 2 weeks return in 2 weeks

## 2019-06-26 NOTE — Progress Notes (Signed)
EMERGENCY ROOM FOLLOW UP  NEW PROBLEM/PATIENT   Patient ID: Russell Warren, male   DOB: Jan 19, 1961, 59 y.o.   MRN: [id number]  Emergency room record from (date) 3 dates, January 8, January 12, January 19 has been reviewed and this is included by reference and includes the review of systems with the following addition:   Chief Complaint  Patient presents with  . Animal Bite    left thigh     HPI Russell Warren is a 59 y.o. male.  Presents for evaluation of dog bite left thigh  59 year old male pitbull bit him left thigh on January 8 he went to the ER on January 12 and then again on the 15th and then again on the 19th.  He is complaining of burning left thigh near the dog bite wound and significant limp  The patient has tried to work through all of this  He is currently on oxycodone for pain he took some Augmentin for 7 days to prevent infection  He does not smoke is not diabetic   Review of Systems Review of Systems  Neurological:       Patient left leg near the thigh  All other systems reviewed and are negative.    has a past medical history of Eczema, History of mumps, History of scarlet fever, HOH (hard of hearing), and Meningitis spinal.   Past Surgical History:  Procedure Laterality Date  . BACK SURGERY  1999  . DENTAL SURGERY    . MIDDLE EAR SURGERY Bilateral     Family History  Problem Relation Age of Onset  . Heart disease Father   . Diabetes Brother     Social History Social History   Tobacco Use  . Smoking status: Never Smoker  . Smokeless tobacco: Never Used  Substance Use Topics  . Alcohol use: No  . Drug use: Not Currently    Frequency: 4.0 times per week    Types: Marijuana    Comment: this afternoon    Allergies  Allergen Reactions  . Latex Rash    Current Outpatient Medications  Medication Sig Dispense Refill  . ALPRAZolam (XANAX) 0.5 MG tablet Take 1 tablet (0.5 mg total) by mouth at bedtime as needed for anxiety. 10 tablet 0   . Na Sulfate-K Sulfate-Mg Sulf (SUPREP BOWEL PREP KIT) 17.5-3.13-1.6 GM/177ML SOLN Take 1 kit by mouth as directed. 1 Bottle 0  . oxyCODONE-acetaminophen (PERCOCET) 5-325 MG tablet Take 1 tablet by mouth every 8 (eight) hours as needed for severe pain. 10 tablet 0   No current facility-administered medications for this visit.    Physical Exam BP 127/87   Pulse 72   Ht '5\' 11"'  (1.803 m)   Wt 220 lb (99.8 kg)   BMI 30.68 kg/m  Body mass index is 30.68 kg/m.  Well developed and well nourished  Stands with normal weight bearing line  Alert and oriented x 3  Normal affect and mood  Ortho Exam  He has a really bad limp favoring the left leg  He has multiple puncture wounds left medial mid thigh with a large mass consistent with fluid however he has good strength in extension of the knee as well as adduction of the leg and thigh  He does have hypersensitivity to touch near the puncture wounds but distally neurovascular exam is intact   Data Reviewed Outside records were reviewed from the emergency room he is been 3 times for dog bite  I have copied some of the  information into the medical record related to this visit HPI Russell Warren is a 59 y.o. male with medical problems documented below who presents the emerge department today approximate 5 days after dog bite.  Patient states he has a park call the/pitbull who attacked him because he was arguing with his wife.  Bit him on the left inner thigh.  Has large amount of bruising and scratches there.  Unknown tetanus shot.  Patient and was been acting normally since then.  Patient without any fevers but just has pain in his leg that does not seem to be improving so presents here for evaluation.  Has been using some antibiotic ointment and ice.  Tylenol and ibuprofen do not seem to be helping.  He also has developed a "fluid sac".    IMAGING From THE ER AND THE REPORT ARE REVIEWED, MY INTERPRETATION OF THE IMAGE(S) IS :    Assessment  Dog bite with hematoma  Plan  Procedure aspiration hematoma left thigh Patient gave consent to have the area aspirated site was confirmed and then prepped with alcohol and ethyl chloride 18-gauge needle was used to aspirate 75 cc of watery blood.  Band-Aid placed with Ace wrap  Recommend gabapentin 100 mg 3 times a day ibuprofen 800 mg 3 times a day and hydrocodone 5 mg every 6 with to work note for 2 weeks and follow-up 2 weeks  Acute complicated injury Outside records Prescription management Minor procedure  Meds ordered this encounter  Medications  . gabapentin (NEURONTIN) 100 MG capsule    Sig: Take 1 capsule (100 mg total) by mouth 3 (three) times daily.    Dispense:  90 capsule    Refill:  2  . ibuprofen (ADVIL) 800 MG tablet    Sig: Take 1 tablet (800 mg total) by mouth every 8 (eight) hours as needed.    Dispense:  30 tablet    Refill:  0  . HYDROcodone-acetaminophen (NORCO/VICODIN) 5-325 MG tablet    Sig: Take 1 tablet by mouth every 6 (six) hours as needed for moderate pain.    Dispense:  30 tablet    Refill:  0      Arther Abbott, MD 06/26/2019 12:15 PM

## 2019-07-10 ENCOUNTER — Other Ambulatory Visit: Payer: Self-pay

## 2019-07-10 ENCOUNTER — Encounter: Payer: Self-pay | Admitting: Orthopedic Surgery

## 2019-07-10 ENCOUNTER — Ambulatory Visit (INDEPENDENT_AMBULATORY_CARE_PROVIDER_SITE_OTHER): Payer: Self-pay | Admitting: Orthopedic Surgery

## 2019-07-10 VITALS — Temp 97.2°F | Ht 71.0 in | Wt 229.0 lb

## 2019-07-10 DIAGNOSIS — T148XXA Other injury of unspecified body region, initial encounter: Secondary | ICD-10-CM

## 2019-07-10 DIAGNOSIS — W540XXD Bitten by dog, subsequent encounter: Secondary | ICD-10-CM

## 2019-07-10 NOTE — Patient Instructions (Signed)
Return to work [employer]

## 2019-07-10 NOTE — Progress Notes (Signed)
Chief Complaint  Patient presents with  . Leg Injury    dog bite 06/07/19 better     Russell Warren 59 years old had a dog bite left thigh on January 8 came to [place] on January 22 with a hematoma pain hypersensitivity to the left thigh we drained his hematoma.  He comes in today with significant improvement no signs of infection.  He does still have some sensitivity to the area but it is much improved he still has a slight alteration in his gait but that is also much improved  There is a little fluid collection near the areas where he was bitten  Otherwise he is doing good his pain medication he does not really need it anymore he took the Augmentin  I am going to release him to normal activities  Encounter Diagnoses  Name Primary?  . Dog bite, subsequent encounter Yes  . Hematoma

## 2019-07-24 ENCOUNTER — Other Ambulatory Visit: Payer: Self-pay

## 2019-07-24 ENCOUNTER — Ambulatory Visit (INDEPENDENT_AMBULATORY_CARE_PROVIDER_SITE_OTHER): Payer: Self-pay | Admitting: Orthopedic Surgery

## 2019-07-24 ENCOUNTER — Encounter: Payer: Self-pay | Admitting: Orthopedic Surgery

## 2019-07-24 VITALS — BP 150/99 | HR 76 | Ht 71.0 in | Wt 225.0 lb

## 2019-07-24 DIAGNOSIS — T148XXD Other injury of unspecified body region, subsequent encounter: Secondary | ICD-10-CM

## 2019-07-24 DIAGNOSIS — W540XXD Bitten by dog, subsequent encounter: Secondary | ICD-10-CM

## 2019-07-24 DIAGNOSIS — T148XXA Other injury of unspecified body region, initial encounter: Secondary | ICD-10-CM

## 2019-07-24 NOTE — Progress Notes (Signed)
Chief Complaint  Patient presents with  . Animal Bite    06/07/19 left thigh swollen again    59 year old male bitten by a pit bull on January 8 I drained some fluid from his thigh which was noninfectious a few weeks back he thought it may have gotten swollen again.  Complains of mild burning sensation there is a palpable defect on the medial aspect of the left proximal thigh from the bite the fluid is only noticeable when the patient is standing.  It does not feel like a lot of fluid so I have opted not to aspirate it  The patient had several questions about disability I referred him to the disability office.  Encounter Diagnoses  Name Primary?  . Dog bite, subsequent encounter Yes  . Hematoma     Recommend follow-up as needed

## 2019-11-11 ENCOUNTER — Other Ambulatory Visit: Payer: Self-pay

## 2019-11-11 ENCOUNTER — Emergency Department (HOSPITAL_COMMUNITY)
Admission: EM | Admit: 2019-11-11 | Discharge: 2019-11-11 | Disposition: A | Discharge status: Home / Self Care | Payer: Self-pay | Attending: Emergency Medicine | Admitting: Emergency Medicine

## 2019-11-11 ENCOUNTER — Encounter (HOSPITAL_COMMUNITY): Payer: Self-pay

## 2019-11-11 DIAGNOSIS — Z91040 Latex allergy status: Secondary | ICD-10-CM | POA: Insufficient documentation

## 2019-11-11 DIAGNOSIS — M545 Low back pain, unspecified: Secondary | ICD-10-CM

## 2019-11-11 MED ORDER — IBUPROFEN 600 MG PO TABS: 600.0000 mg | ORAL_TABLET | Freq: Four times a day (QID) | ORAL | 0 refills | Status: DC | PRN

## 2019-11-11 MED ORDER — CYCLOBENZAPRINE HCL 10 MG PO TABS: 10.0000 mg | ORAL_TABLET | Freq: Three times a day (TID) | ORAL | 0 refills | Status: DC | PRN

## 2019-11-11 MED ORDER — KETOROLAC TROMETHAMINE 60 MG/2ML IM SOLN
30.0000 mg | Freq: Once | INTRAMUSCULAR | Status: AC
  Administered 2019-11-11: 30 mg via INTRAMUSCULAR
  Filled 2019-11-11: qty 2

## 2019-11-11 NOTE — Discharge Instructions (Signed)
Please follow-up with your family doctor within the next 2 weeks if you are not completely better.  Many types of back pain can last for quite some time but should gradually improve with medications  Take ibuprofen 600 mg every 8 hours as needed for pain  Take Flexeril 10 mg every 8 hours as needed for muscle spasm  You may use ice and rest, avoid heavy lifting or bending over for the next several days.  Get plenty of rest, drink plenty of liquids and return to the hospital for any of the following symptoms  Worsening symptoms Increasing pain Difficulty urinating Numbness or weakness Fevers or chills Or any other severe or worsening complaints.

## 2019-11-11 NOTE — ED Provider Notes (Signed)
[facility] EMERGENCY DEPARTMENT Provider Note   CSN: [id number] Arrival date & time: 11/11/19  9833     History Chief Complaint  Patient presents with  . Back Pain    Russell Warren is a 59 y.o. male.  HPI   RLB pain after lifting a couch over the weekend - is in the gluteal area - has been persistent since - worse with changing position - is 8/10, tried ice packs / tylenol without relief - hx of rupture disc with surgery over 20 years ago.  Has not had much problem with that since.  No falls, no neuro c/o in the legs - no urinary sx, no incont / f/c/.  Worried about going back to work and sleep.  Past Medical History:  Diagnosis Date  . Eczema   . History of mumps   . History of scarlet fever   . HOH (hard of hearing)   . Meningitis spinal    history of twice age 34 & 67    Patient Active Problem List   Diagnosis Date Noted  . Osteomyelitis ([place]) 02/26/2018  . Foot pain, right 02/26/2018  . Osteomyelitis of right foot ([place]) 02/26/2018    Past Surgical History:  Procedure Laterality Date  . BACK SURGERY  1999  . DENTAL SURGERY    . MIDDLE EAR SURGERY Bilateral        Family History  Problem Relation Age of Onset  . Heart disease Father   . Diabetes Brother     Social History   Tobacco Use  . Smoking status: Never Smoker  . Smokeless tobacco: Never Used  Vaping Use  . Vaping Use: Never used  Substance Use Topics  . Alcohol use: No  . Drug use: Not on file    Home Medications Prior to Admission medications   Medication Sig Start Date End Date Taking? Authorizing Provider  ALPRAZolam Duanne Moron) 0.5 MG tablet Take 1 tablet (0.5 mg total) by mouth at bedtime as needed for anxiety. Patient not taking: Reported on 07/24/2019 04/06/19   Veryl Speak, MD  cyclobenzaprine (FLEXERIL) 10 MG tablet Take 1 tablet (10 mg total) by mouth 3 (three) times daily as needed for muscle spasms. 11/11/19   Noemi Chapel, MD  gabapentin (NEURONTIN) 100 MG capsule Take 1  capsule (100 mg total) by mouth 3 (three) times daily. Patient not taking: Reported on 07/24/2019 06/26/19   Carole Civil, MD  ibuprofen (ADVIL) 600 MG tablet Take 1 tablet (600 mg total) by mouth every 6 (six) hours as needed. 11/11/19   Noemi Chapel, MD  Na Sulfate-K Sulfate-Mg Sulf (SUPREP BOWEL PREP KIT) 17.5-3.13-1.6 GM/177ML SOLN Take 1 kit by mouth as directed. Patient not taking: Reported on 07/24/2019 08/04/18   Annitta Needs, NP    Allergies    Latex  Review of Systems   Review of Systems  Constitutional: Negative for fever.  Musculoskeletal: Positive for back pain.  Neurological: Negative for weakness and numbness.    Physical Exam Updated Vital Signs BP (!) 144/105 (BP Location: Left Arm)   Pulse 60   Temp 97.8 F (36.6 C) (Oral)   Resp 16   Ht 1.803 m ('5\' 11"' )   Wt 102.1 kg   SpO2 96%   BMI 31.38 kg/m   Physical Exam Constitutional:      General: He is not in acute distress.    Appearance: He is well-developed. He is not diaphoretic.  HENT:     Head: Normocephalic and atraumatic.  Eyes:     General: No scleral icterus.       Right eye: No discharge.        Left eye: No discharge.     Conjunctiva/sclera: Conjunctivae normal.  Cardiovascular:     Rate and Rhythm: Normal rate and regular rhythm.  Pulmonary:     Effort: Pulmonary effort is normal.     Breath sounds: Normal breath sounds.  Musculoskeletal:     Comments: No Tenderness of the back over the muscles or spine though has some ttp ove the R gluteal muscle. No tenderness over the Cervical, Thoracic or Lumbar Spine  Skin:    General: Skin is warm and dry.     Findings: No rash.  Neurological:     Comments: Speech is clear, strength in the UE and LE's are normal at the major muscle groups including the hip, knee and ankles.  Sensation in tact to light touch and pin prick of the bilateral LE's.  Normal reflexes at the knees bilaterally.  Gait antalgic secondary to pain.     ED Results /  Procedures / Treatments   Labs (all labs ordered are listed, but only abnormal results are displayed) Labs Reviewed - No data to display  EKG None  Radiology No results found.  Procedures Procedures (including critical care time)  Medications Ordered in ED Medications  ketorolac (TORADOL) injection 30 mg (30 mg Intramuscular Given 11/11/19 0800)    ED Course  I have reviewed the triage vital signs and the nursing notes.  Pertinent labs & imaging results that were available during my care of the patient were reviewed by me and considered in my medical decision making (see chart for details).    MDM Rules/Calculators/A&P                      This patient's physical exam and history and presentation suggest more of a musculoskeletal cause.  He has no radicular symptoms, the pain does not radiate below the buttocks and watching the patient ambulate in the emergency department is reassuring.  He is well-appearing without fever or significant high risk for pathologic causes of his back pain and thus is stable for discharge with medications as below including an anti-inflammatory.  The patient is aware of the indications for return and agreeable.  He was given intramuscular Toradol prior to discharge  Final Clinical Impression(s) / ED Diagnoses Final diagnoses:  Acute right-sided low back pain without sciatica    Rx / DC Orders ED Discharge Orders         Ordered    ibuprofen (ADVIL) 600 MG tablet  Every 6 hours PRN     Discontinue  Reprint     11/11/19 0748    cyclobenzaprine (FLEXERIL) 10 MG tablet  3 times daily PRN     Discontinue  Reprint     11/11/19 0748           Noemi Chapel, MD 11/12/19 [phone]

## 2019-11-11 NOTE — ED Triage Notes (Signed)
Pt reports history of back surgery years ago.  Reports lower back pain radiating into r buttock since lifting something heavy Friday.

## 2019-11-24 ENCOUNTER — Telehealth: Payer: Self-pay | Admitting: Orthopedic Surgery

## 2019-11-24 NOTE — Telephone Encounter (Signed)
Mr. Russell Warren called this morning stating that he thought he needed to have fluid removed from his leg where he had a previous cog bite.  I offered an appointment for this coming Friday morning(office supervisor also agreed this date & time).  He said he couldn't come on Friday due to the fact that he is starting a new job on Friday.  He said he will go by and let his PCP take a look at it.

## 2019-11-25 ENCOUNTER — Telehealth: Payer: Self-pay | Admitting: Orthopedic Surgery

## 2019-11-25 NOTE — Telephone Encounter (Signed)
I received a call from [facility] asking if we could see this patient this afternoon for his leg cyst/boil.  I spoke with the office supervisor, Cherre Huger and she also looked over the schedule.  It was determined that we could offer an appointment for 8:30 on Friday morning.  The nurse from Dr Sharyon Medicus office spoke to the patient who declined this appointment.  I told her that if we had any cancellations for tomorrow we will definitely give Mr Russell Warren a call to get him in to be seen.

## 2019-12-11 ENCOUNTER — Other Ambulatory Visit: Payer: Self-pay

## 2019-12-11 ENCOUNTER — Ambulatory Visit: Payer: Self-pay | Admitting: Orthopedic Surgery

## 2019-12-11 ENCOUNTER — Encounter: Payer: Self-pay | Admitting: Orthopedic Surgery

## 2019-12-11 VITALS — BP 134/95 | HR 78 | Ht 71.5 in | Wt 230.0 lb

## 2019-12-11 DIAGNOSIS — T148XXA Other injury of unspecified body region, initial encounter: Secondary | ICD-10-CM

## 2019-12-11 DIAGNOSIS — W540XXD Bitten by dog, subsequent encounter: Secondary | ICD-10-CM

## 2019-12-11 NOTE — Progress Notes (Signed)
Chief Complaint  Patient presents with  . Leg Pain    dog bite 06/07/19 left thigh painful again swelling again 6 weeks    59 year old male had recurrence of swelling left thigh status post dog bite back in February.  He has not noted any new trauma but he does report straining his left leg  He has a large mass over the left thigh on the inner aspect feels firm to touch  He is given consent for aspiration Aspiration left thigh   I cleaned the area with alcohol three times sprayed with ethyl chloride and 18-gauge needle aspirated 32 cc of dark blood  Patient can come back for aspirations on a as needed basis  Encounter Diagnoses  Name Primary?  . Hematoma Yes  . Dog bite, subsequent encounter

## 2019-12-18 ENCOUNTER — Ambulatory Visit: Payer: Self-pay | Admitting: Orthopedic Surgery

## 2019-12-28 ENCOUNTER — Telehealth: Payer: Self-pay | Admitting: Orthopedic Surgery

## 2019-12-28 NOTE — Telephone Encounter (Signed)
If note says ok to schedule for this, please call and schedule sometime this week, if ok with patient.

## 2019-12-28 NOTE — Telephone Encounter (Signed)
Patient called to relay that the area on his left leg has fluid again, status/post dog bite in February; requests to be seen for this recurring issue. Last office note, 12/11/19, indicates:  "Patient can come back for aspirations on a as needed basis".  Based on acuity of patient's medical problem, and on current schedule, please advise.

## 2019-12-28 NOTE — Telephone Encounter (Signed)
Called patient, left message offering appointment.

## 2019-12-29 ENCOUNTER — Encounter: Payer: Self-pay | Admitting: Orthopedic Surgery

## 2019-12-29 ENCOUNTER — Ambulatory Visit: Payer: Self-pay | Admitting: Orthopedic Surgery

## 2019-12-29 ENCOUNTER — Other Ambulatory Visit: Payer: Self-pay

## 2019-12-29 VITALS — BP 143/101 | HR 81 | Ht 71.5 in | Wt 230.0 lb

## 2019-12-29 DIAGNOSIS — W540XXD Bitten by dog, subsequent encounter: Secondary | ICD-10-CM

## 2019-12-29 DIAGNOSIS — T148XXA Other injury of unspecified body region, initial encounter: Secondary | ICD-10-CM

## 2019-12-29 NOTE — Progress Notes (Signed)
Chief Complaint  Patient presents with  . Leg Pain    06/07/19 left thigh hematoma    59 year old male had a dog bite back in January and had a hematoma which was aspirated he did well after 2 or 3 aspirations for about 5 months and then in July the hematoma came back.  We aspirated a large amount of blood from the hematoma July 9 and it recurred presents for reaspiration  After verbal consent was given Timeout was taken to confirm the site medial side of the left thigh was aspirated approxitwenty 5 cc of dark bloody fluid  Patient says he would like to wait on any surgery until he can get insurance  Encounter Diagnoses  Name Primary?  . Hematoma Yes  . Dog bite, subsequent encounter

## 2020-01-20 ENCOUNTER — Encounter: Payer: Self-pay | Admitting: Orthopedic Surgery

## 2020-01-20 ENCOUNTER — Ambulatory Visit (INDEPENDENT_AMBULATORY_CARE_PROVIDER_SITE_OTHER): Payer: Self-pay | Admitting: Orthopedic Surgery

## 2020-01-20 ENCOUNTER — Other Ambulatory Visit: Payer: Self-pay

## 2020-01-20 VITALS — BP 148/102 | HR 71 | Ht 71.5 in | Wt 235.0 lb

## 2020-01-20 DIAGNOSIS — W540XXA Bitten by dog, initial encounter: Secondary | ICD-10-CM | POA: Insufficient documentation

## 2020-01-20 DIAGNOSIS — W540XXD Bitten by dog, subsequent encounter: Secondary | ICD-10-CM

## 2020-01-20 DIAGNOSIS — T148XXA Other injury of unspecified body region, initial encounter: Secondary | ICD-10-CM

## 2020-01-20 NOTE — Progress Notes (Signed)
Chief Complaint  Patient presents with  . Knee Pain    Patient having left knee pain and swelling.     59 year old male had a dog bite has recurrent dark blood accumulation medial side left mid to upper thigh comes in for reaspiration  Patient wants to have surgery does not have insurance  Patient gave consent site confirmed site prepped alcohol and ethyl chloride 18-gauge needle 10 cc syringe aspirated 15 cc of dark blood  Follow-up when needed aspiration until he can have the surgery

## 2020-01-22 ENCOUNTER — Ambulatory Visit: Payer: Self-pay | Admitting: Orthopedic Surgery

## 2020-01-29 ENCOUNTER — Ambulatory Visit: Admission: EM | Admit: 2020-01-29 | Discharge: 2020-01-29 | Discharge status: Absent without leave | Payer: Self-pay

## 2020-01-29 ENCOUNTER — Other Ambulatory Visit: Payer: Self-pay

## 2020-02-18 ENCOUNTER — Ambulatory Visit: Payer: Self-pay | Admitting: Orthopedic Surgery

## 2020-03-07 ENCOUNTER — Other Ambulatory Visit: Payer: Self-pay

## 2020-03-07 ENCOUNTER — Encounter: Payer: Self-pay | Admitting: Orthopedic Surgery

## 2020-03-07 ENCOUNTER — Ambulatory Visit (INDEPENDENT_AMBULATORY_CARE_PROVIDER_SITE_OTHER): Payer: Commercial Managed Care - PPO | Admitting: Orthopedic Surgery

## 2020-03-07 VITALS — BP 147/105 | HR 75 | Ht 71.5 in | Wt 235.0 lb

## 2020-03-07 DIAGNOSIS — M79605 Pain in left leg: Secondary | ICD-10-CM | POA: Diagnosis not present

## 2020-03-07 DIAGNOSIS — T148XXA Other injury of unspecified body region, initial encounter: Secondary | ICD-10-CM | POA: Diagnosis not present

## 2020-03-07 NOTE — Progress Notes (Signed)
Chief Complaint  Patient presents with  . Animal Bite    06/07/19 hematoma dog bite / wants to discuss surgery    LEFT THIGH chronic hematoma from a dog bite  We discussed surgery.  We do not know what we will find whether this is an leaky blood vessel or muscle tissue I discussed this with him at length I do not know the postop course  A full history and physical will be done at a later date and is incorporated by reference  Past Medical History:  Diagnosis Date  . Eczema   . History of mumps   . History of scarlet fever   . HOH (hard of hearing)   . Meningitis spinal    history of twice age 59 & 86   Past Surgical History:  Procedure Laterality Date  . BACK SURGERY  1999  . DENTAL SURGERY    . MIDDLE EAR SURGERY Bilateral    Social History   Tobacco Use  . Smoking status: Never Smoker  . Smokeless tobacco: Never Used  Vaping Use  . Vaping Use: Never used  Substance Use Topics  . Alcohol use: No  . Drug use: Not on file   Family History  Problem Relation Age of Onset  . Heart disease Father   . Diabetes Brother    Allergies  Allergen Reactions  . Latex Rash    Current Outpatient Medications:  .  cyclobenzaprine (FLEXERIL) 10 MG tablet, Take 1 tablet (10 mg total) by mouth 3 (three) times daily as needed for muscle spasms. (Patient not taking: Reported on 12/11/2019), Disp: 20 tablet, Rfl: 0 .  gabapentin (NEURONTIN) 100 MG capsule, Take 1 capsule (100 mg total) by mouth 3 (three) times daily. (Patient not taking: Reported on 07/24/2019), Disp: 90 capsule, Rfl: 2  BP (!) 147/105   Pulse 75   Ht 5' 11.5" (1.816 m)   Wt 235 lb (106.6 kg)   BMI 32.32 kg/m   Encounter Diagnosis  Name Primary?  . Hematoma Yes

## 2020-03-08 ENCOUNTER — Telehealth: Payer: Self-pay | Admitting: Orthopedic Surgery

## 2020-03-08 ENCOUNTER — Telehealth: Payer: Self-pay | Admitting: Radiology

## 2020-03-08 NOTE — Telephone Encounter (Signed)
Russell Warren called and stated that he was just informed by short stay that his surgery is still coming Friday morning at 7:15.  He said he asked them what time he needed to be there and he said he was told to call our office to find this out.  He wants to know what time to be there Friday morning.  He went on to say that his phone does not receive messages that well so he will call us back this afternoon between 4:00 and 4:30.  Thanks

## 2020-03-08 NOTE — Telephone Encounter (Signed)
Called patient he needs to call Eber Jones at Preop [phone] His mailbox is full

## 2020-03-08 NOTE — Telephone Encounter (Signed)
-----   Message from Nobie Putnam sent at 03/08/2020  8:21 AM EDT ----- This patient was added yesterday for this Friday.  I have tried to call him multiple times.  His mobile # says VM is full and I left a message at his home with someone but he still hasn't called me back.  I really need to talk to him today in order to have enough time to get his PAT & covid scheduled.  Will you try calling him please.  Thanks,  Eber Jones

## 2020-03-08 NOTE — Telephone Encounter (Signed)
I need him to call Eber Jones, I put this in the previous phone note The number is [phone]  The hospital gives all instructions  His  Voice mail is full

## 2020-03-08 NOTE — Telephone Encounter (Signed)
I told Russell Warren the front desk did not give him her number she went ahead and gave me the preop and covid times  Ival Bible, Ermelinda Eckert W, RT I scheduled his PAT & covid for 10/7 @ 8:30. I know that means he can't work that day, but that's as close as I can get it & be able to the the covid test back.   I will call him after 4

## 2020-03-08 NOTE — Telephone Encounter (Signed)
Patient has been advised when and where his appointment date and time is.   He fully understood and repeated it all back to me.

## 2020-03-09 NOTE — Patient Instructions (Addendum)
Russell Warren  03/09/2020     @PREFPERIOPPHARMACY @   Your procedure is scheduled on  03/11/2020.  Report to [facility] at  [phone]  A.M.  Call this number if you have problems the morning of surgery:  [phone]   Remember:  Do not eat or drink after midnight.                         Take these medicines the morning of surgery with A SIP OF WATER  None    Do not wear jewelry, make-up or nail polish.  Do not wear lotions, powders, or perfume. Please wear deodorant and brush your teeth.  Do not shave 48 hours prior to surgery.  Men may shave face and neck.  Do not bring valuables to the hospital.  [facility] is not responsible for any belongings or valuables.  Contacts, dentures or bridgework may not be worn into surgery.  Leave your suitcase in the car.  After surgery it may be brought to your room.  For patients admitted to the hospital, discharge time will be determined by your treatment team.  Patients discharged the day of surgery will not be allowed to drive home.   Name and phone number of your driver:   family Special instructions:  DO NOT smoke the morning of your procedure.  Please read over the following fact sheets that you were given. Anesthesia Post-op Instructions and Care and Recovery After Surgery       Incision and Drainage, Care After This sheet gives you information about how to care for yourself after your procedure. Your health care provider may also give you more specific instructions. If you have problems or questions, contact your health care provider. What can I expect after the procedure? After the procedure, it is common to have:  Pain or discomfort around the incision site.  Blood, fluid, or pus (drainage) from the incision.  Redness and firm skin around the incision site. Follow these instructions at home: Medicines  Take over-the-counter and prescription medicines only as told by your health care provider.  If you  were prescribed an antibiotic medicine, use or take it as told by your health care provider. Do not stop using the antibiotic even if you start to feel better. Wound care Follow instructions from your health care provider about how to take care of your wound. Make sure you:  Wash your hands with soap and water before and after you change your bandage (dressing). If soap and water are not available, use hand sanitizer.  Change your dressing and packing as told by your health care provider. ? If your dressing is dry or stuck when you try to remove it, moisten or wet the dressing with saline or water so that it can be removed without harming your skin or tissues. ? If your wound is packed, leave it in place until your health care provider tells you to remove it. To remove the packing, moisten or wet the packing with saline or water so that it can be removed without harming your skin or tissues.  Leave stitches (sutures), skin glue, or adhesive strips in place. These skin closures may need to stay in place for 2 weeks or longer. If adhesive strip edges start to loosen and curl up, you may trim the loose edges. Do not remove adhesive strips completely unless your health care provider tells you to do that.  Check your wound every day for signs of infection. Check for:  More redness, swelling, or pain.  More fluid or blood.  Warmth.  Pus or a bad smell. If you were sent home with a drain tube in place, follow instructions from your health care provider about:  How to empty it.  How to care for it at home.  General instructions  Rest the affected area.  Do not take baths, swim, or use a hot tub until your health care provider approves. Ask your health care provider if you may take showers. You may only be allowed to take sponge baths.  Return to your normal activities as told by your health care provider. Ask your health care provider what activities are safe for you. Your health care provider  may put you on activity or lifting restrictions.  The incision will continue to drain. It is normal to have some clear or slightly bloody drainage. The amount of drainage should lessen each day.  Do not apply any creams, ointments, or liquids unless you have been told to by your health care provider.  Keep all follow-up visits as told by your health care provider. This is important. Contact a health care provider if:  Your cyst or abscess returns.  You have a fever or chills.  You have more redness, swelling, or pain around your incision.  You have more fluid or blood coming from your incision.  Your incision feels warm to the touch.  You have pus or a bad smell coming from your incision.  You have red streaks above or below the incision site. Get help right away if:  You have severe pain or bleeding.  You cannot eat or drink without vomiting.  You have decreased urine output.  You become short of breath.  You have chest pain.  You cough up blood.  The affected area becomes numb or starts to tingle. These symptoms may represent a serious problem that is an emergency. Do not wait to see if the symptoms will go away. Get medical help right away. Call your local emergency services (911 in the [place] Do not drive yourself to the hospital. Summary  After this procedure, it is common to have fluid, blood, or pus coming from the surgery site.  Follow all home care instructions. You will be told how to take care of your incision, how to check for infection, and how to take medicines.  If you were prescribed an antibiotic medicine, take it as told by your health care provider. Do not stop taking the antibiotic even if you start to feel better.  Contact a health care provider if you have increased redness, swelling, or pain around your incision. Get help right away if you have chest pain, you vomit, you cough up blood, or you have shortness of breath.  Keep all follow-up visits  as told by your health care provider. This is important. This information is not intended to replace advice given to you by your health care provider. Make sure you discuss any questions you have with your health care provider. Document Revised: 04/21/2018 Document Reviewed: 04/21/2018 Elsevier Patient Education  2020 [employer]  General Anesthesia, Adult, Care After This sheet gives you information about how to care for yourself after your procedure. Your health care provider may also give you more specific instructions. If you have problems or questions, contact your health care provider. What can I expect after the procedure? After the procedure, the following side effects are  common:  Pain or discomfort at the IV site.  Nausea.  Vomiting.  Sore throat.  Trouble concentrating.  Feeling cold or chills.  Weak or tired.  Sleepiness and fatigue.  Soreness and body aches. These side effects can affect parts of the body that were not involved in surgery. Follow these instructions at home:  For at least 24 hours after the procedure:  Have a responsible adult stay with you. It is important to have someone help care for you until you are awake and alert.  Rest as needed.  Do not: ? Participate in activities in which you could fall or become injured. ? Drive. ? Use heavy machinery. ? Drink alcohol. ? Take sleeping pills or medicines that cause drowsiness. ? Make important decisions or sign legal documents. ? Take care of children on your own. Eating and drinking  Follow any instructions from your health care provider about eating or drinking restrictions.  When you feel hungry, start by eating small amounts of foods that are soft and easy to digest (bland), such as toast. Gradually return to your regular diet.  Drink enough fluid to keep your urine pale yellow.  If you vomit, rehydrate by drinking water, juice, or clear broth. General instructions  If you have sleep  apnea, surgery and certain medicines can increase your risk for breathing problems. Follow instructions from your health care provider about wearing your sleep device: ? Anytime you are sleeping, including during daytime naps. ? While taking prescription pain medicines, sleeping medicines, or medicines that make you drowsy.  Return to your normal activities as told by your health care provider. Ask your health care provider what activities are safe for you.  Take over-the-counter and prescription medicines only as told by your health care provider.  If you smoke, do not smoke without supervision.  Keep all follow-up visits as told by your health care provider. This is important. Contact a health care provider if:  You have nausea or vomiting that does not get better with medicine.  You cannot eat or drink without vomiting.  You have pain that does not get better with medicine.  You are unable to pass urine.  You develop a skin rash.  You have a fever.  You have redness around your IV site that gets worse. Get help right away if:  You have difficulty breathing.  You have chest pain.  You have blood in your urine or stool, or you vomit blood. Summary  After the procedure, it is common to have a sore throat or nausea. It is also common to feel tired.  Have a responsible adult stay with you for the first 24 hours after general anesthesia. It is important to have someone help care for you until you are awake and alert.  When you feel hungry, start by eating small amounts of foods that are soft and easy to digest (bland), such as toast. Gradually return to your regular diet.  Drink enough fluid to keep your urine pale yellow.  Return to your normal activities as told by your health care provider. Ask your health care provider what activities are safe for you. This information is not intended to replace advice given to you by your health care provider. Make sure you discuss any  questions you have with your health care provider. Document Revised: 05/24/2017 Document Reviewed: 01/04/2017 Elsevier Patient Education  2020 [employer]. How to Use Chlorhexidine for Bathing Chlorhexidine gluconate (CHG) is a germ-killing (antiseptic) solution that is used  to clean the skin. It can get rid of the bacteria that normally live on the skin and can keep them away for about 24 hours. To clean your skin with CHG, you may be given:  A CHG solution to use in the shower or as part of a sponge bath.  A prepackaged cloth that contains CHG. Cleaning your skin with CHG may help lower the risk for infection:  While you are staying in the intensive care unit of the hospital.  If you have a vascular access, such as a central line, to provide short-term or long-term access to your veins.  If you have a catheter to drain urine from your bladder.  If you are on a ventilator. A ventilator is a machine that helps you breathe by moving air in and out of your lungs.  After surgery. What are the risks? Risks of using CHG include:  A skin reaction.  Hearing loss, if CHG gets in your ears.  Eye injury, if CHG gets in your eyes and is not rinsed out.  The CHG product catching fire. Make sure that you avoid smoking and flames after applying CHG to your skin. Do not use CHG:  If you have a chlorhexidine allergy or have previously reacted to chlorhexidine.  On babies younger than 67 months of age. How to use CHG solution  Use CHG only as told by your health care provider, and follow the instructions on the label.  Use the full amount of CHG as directed. Usually, this is one bottle. During a shower Follow these steps when using CHG solution during a shower (unless your health care provider gives you different instructions): 1. Start the shower. 2. Use your normal soap and shampoo to wash your face and hair. 3. Turn off the shower or move out of the shower stream. 4. Pour the CHG  onto a clean washcloth. Do not use any type of brush or rough-edged sponge. 5. Starting at your neck, lather your body down to your toes. Make sure you follow these instructions: ? If you will be having surgery, pay special attention to the part of your body where you will be having surgery. Scrub this area for at least 1 minute. ? Do not use CHG on your head or face. If the solution gets into your ears or eyes, rinse them well with water. ? Avoid your genital area. ? Avoid any areas of skin that have broken skin, cuts, or scrapes. ? Scrub your back and under your arms. Make sure to wash skin folds. 6. Let the lather sit on your skin for 1-2 minutes or as long as told by your health care provider. 7. Thoroughly rinse your entire body in the shower. Make sure that all body creases and crevices are rinsed well. 8. Dry off with a clean towel. Do not put any substances on your body afterward--such as powder, lotion, or perfume--unless you are told to do so by your health care provider. Only use lotions that are recommended by the manufacturer. 9. Put on clean clothes or pajamas. 10. If it is the night before your surgery, sleep in clean sheets.  During a sponge bath Follow these steps when using CHG solution during a sponge bath (unless your health care provider gives you different instructions): 1. Use your normal soap and shampoo to wash your face and hair. 2. Pour the CHG onto a clean washcloth. 3. Starting at your neck, lather your body down to your toes. Make sure  you follow these instructions: ? If you will be having surgery, pay special attention to the part of your body where you will be having surgery. Scrub this area for at least 1 minute. ? Do not use CHG on your head or face. If the solution gets into your ears or eyes, rinse them well with water. ? Avoid your genital area. ? Avoid any areas of skin that have broken skin, cuts, or scrapes. ? Scrub your back and under your arms. Make  sure to wash skin folds. 4. Let the lather sit on your skin for 1-2 minutes or as long as told by your health care provider. 5. Using a different clean, wet washcloth, thoroughly rinse your entire body. Make sure that all body creases and crevices are rinsed well. 6. Dry off with a clean towel. Do not put any substances on your body afterward--such as powder, lotion, or perfume--unless you are told to do so by your health care provider. Only use lotions that are recommended by the manufacturer. 7. Put on clean clothes or pajamas. 8. If it is the night before your surgery, sleep in clean sheets. How to use CHG prepackaged cloths  Only use CHG cloths as told by your health care provider, and follow the instructions on the label.  Use the CHG cloth on clean, dry skin.  Do not use the CHG cloth on your head or face unless your health care provider tells you to.  When washing with the CHG cloth: ? Avoid your genital area. ? Avoid any areas of skin that have broken skin, cuts, or scrapes. Before surgery Follow these steps when using a CHG cloth to clean before surgery (unless your health care provider gives you different instructions): 1. Using the CHG cloth, vigorously scrub the part of your body where you will be having surgery. Scrub using a back-and-forth motion for 3 minutes. The area on your body should be completely wet with CHG when you are done scrubbing. 2. Do not rinse. Discard the cloth and let the area air-dry. Do not put any substances on the area afterward, such as powder, lotion, or perfume. 3. Put on clean clothes or pajamas. 4. If it is the night before your surgery, sleep in clean sheets.  For general bathing Follow these steps when using CHG cloths for general bathing (unless your health care provider gives you different instructions). 1. Use a separate CHG cloth for each area of your body. Make sure you wash between any folds of skin and between your fingers and toes. Wash  your body in the following order, switching to a new cloth after each step: ? The front of your neck, shoulders, and chest. ? Both of your arms, under your arms, and your hands. ? Your stomach and groin area, avoiding the genitals. ? Your right leg and foot. ? Your left leg and foot. ? The back of your neck, your back, and your buttocks. 2. Do not rinse. Discard the cloth and let the area air-dry. Do not put any substances on your body afterward--such as powder, lotion, or perfume--unless you are told to do so by your health care provider. Only use lotions that are recommended by the manufacturer. 3. Put on clean clothes or pajamas. Contact a health care provider if:  Your skin gets irritated after scrubbing.  You have questions about using your solution or cloth. Get help right away if:  Your eyes become very red or swollen.  Your eyes itch badly.  Your skin itches badly and is red or swollen.  Your hearing changes.  You have trouble seeing.  You have swelling or tingling in your mouth or throat.  You have trouble breathing.  You swallow any chlorhexidine. Summary  Chlorhexidine gluconate (CHG) is a germ-killing (antiseptic) solution that is used to clean the skin. Cleaning your skin with CHG may help to lower your risk for infection.  You may be given CHG to use for bathing. It may be in a bottle or in a prepackaged cloth to use on your skin. Carefully follow your health care provider's instructions and the instructions on the product label.  Do not use CHG if you have a chlorhexidine allergy.  Contact your health care provider if your skin gets irritated after scrubbing. This information is not intended to replace advice given to you by your health care provider. Make sure you discuss any questions you have with your health care provider. Document Revised: 08/07/2018 Document Reviewed: 04/18/2017 Elsevier Patient Education  2020 [employer].  Hypertension,  Adult Hypertension is another name for high blood pressure. High blood pressure forces your heart to work harder to pump blood. This can cause problems over time. There are two numbers in a blood pressure reading. There is a top number (systolic) over a bottom number (diastolic). It is best to have a blood pressure that is below 120/80. Healthy choices can help lower your blood pressure, or you may need medicine to help lower it. What are the causes? The cause of this condition is not known. Some conditions may be related to high blood pressure. What increases the risk?  Smoking.  Having type 2 diabetes mellitus, high cholesterol, or both.  Not getting enough exercise or physical activity.  Being overweight.  Having too much fat, sugar, calories, or salt (sodium) in your diet.  Drinking too much alcohol.  Having long-term (chronic) kidney disease.  Having a family history of high blood pressure.  Age. Risk increases with age.  Race. You may be at higher risk if you are African American.  Gender. Men are at higher risk than women before age 3. After age 34, women are at higher risk than men.  Having obstructive sleep apnea.  Stress. What are the signs or symptoms?  High blood pressure may not cause symptoms. Very high blood pressure (hypertensive crisis) may cause: ? Headache. ? Feelings of worry or nervousness (anxiety). ? Shortness of breath. ? Nosebleed. ? A feeling of being sick to your stomach (nausea). ? Throwing up (vomiting). ? Changes in how you see. ? Very bad chest pain. ? Seizures. How is this treated?  This condition is treated by making healthy lifestyle changes, such as: ? Eating healthy foods. ? Exercising more. ? Drinking less alcohol.  Your health care provider may prescribe medicine if lifestyle changes are not enough to get your blood pressure under control, and if: ? Your top number is above 130. ? Your bottom number is above 80.  Your  personal target blood pressure may vary. Follow these instructions at home: Eating and drinking   If told, follow the DASH eating plan. To follow this plan: ? Fill one half of your plate at each meal with fruits and vegetables. ? Fill one fourth of your plate at each meal with whole grains. Whole grains include whole-wheat pasta, brown rice, and whole-grain bread. ? Eat or drink low-fat dairy products, such as skim milk or low-fat yogurt. ? Fill one fourth of your plate at  each meal with low-fat (lean) proteins. Low-fat proteins include fish, chicken without skin, eggs, beans, and tofu. ? Avoid fatty meat, cured and processed meat, or chicken with skin. ? Avoid pre-made or processed food.  Eat less than 1,500 mg of salt each day.  Do not drink alcohol if: ? Your doctor tells you not to drink. ? You are pregnant, may be pregnant, or are planning to become pregnant.  If you drink alcohol: ? Limit how much you use to:  0-1 drink a day for women.  0-2 drinks a day for men. ? Be aware of how much alcohol is in your drink. In the [place] one drink equals one 12 oz bottle of beer (355 mL), one 5 oz glass of wine (148 mL), or one 1 oz glass of hard liquor (44 mL). Lifestyle   Work with your doctor to stay at a healthy weight or to lose weight. Ask your doctor what the best weight is for you.  Get at least 30 minutes of exercise most days of the week. This may include walking, swimming, or biking.  Get at least 30 minutes of exercise that strengthens your muscles (resistance exercise) at least 3 days a week. This may include lifting weights or doing Pilates.  Do not use any products that contain nicotine or tobacco, such as cigarettes, e-cigarettes, and chewing tobacco. If you need help quitting, ask your doctor.  Check your blood pressure at home as told by your doctor.  Keep all follow-up visits as told by your doctor. This is important. Medicines  Take over-the-counter and  prescription medicines only as told by your doctor. Follow directions carefully.  Do not skip doses of blood pressure medicine. The medicine does not work as well if you skip doses. Skipping doses also puts you at risk for problems.  Ask your doctor about side effects or reactions to medicines that you should watch for. Contact a doctor if you:  Think you are having a reaction to the medicine you are taking.  Have headaches that keep coming back (recurring).  Feel dizzy.  Have swelling in your ankles.  Have trouble with your vision. Get help right away if you:  Get a very bad headache.  Start to feel mixed up (confused).  Feel weak or numb.  Feel faint.  Have very bad pain in your: ? Chest. ? Belly (abdomen).  Throw up more than once.  Have trouble breathing. Summary  Hypertension is another name for high blood pressure.  High blood pressure forces your heart to work harder to pump blood.  For most people, a normal blood pressure is less than 120/80.  Making healthy choices can help lower blood pressure. If your blood pressure does not get lower with healthy choices, you may need to take medicine. This information is not intended to replace advice given to you by your health care provider. Make sure you discuss any questions you have with your health care provider. Document Revised: 01/29/2018 Document Reviewed: 01/29/2018 Elsevier Patient Education  2020 [employer] Inc[employer]  DASH Eating Plan DASH stands for "Dietary Approaches to Stop Hypertension." The DASH eating plan is a healthy eating plan that has been shown to reduce high blood pressure (hypertension). It may also reduce your risk for type 2 diabetes, heart disease, and stroke. The DASH eating plan may also help with weight loss. What are tips for following this plan?  General guidelines  Avoid eating more than 2,300 mg (milligrams) of salt (sodium) a day.  If you have hypertension, you may need to reduce your  sodium intake to 1,500 mg a day.  Limit alcohol intake to no more than 1 drink a day for nonpregnant women and 2 drinks a day for men. One drink equals 12 oz of beer, 5 oz of wine, or 1 oz of hard liquor.  Work with your health care provider to maintain a healthy body weight or to lose weight. Ask what an ideal weight is for you.  Get at least 30 minutes of exercise that causes your heart to beat faster (aerobic exercise) most days of the week. Activities may include walking, swimming, or biking.  Work with your health care provider or diet and nutrition specialist (dietitian) to adjust your eating plan to your individual calorie needs. Reading food labels   Check food labels for the amount of sodium per serving. Choose foods with less than 5 percent of the Daily Value of sodium. Generally, foods with less than 300 mg of sodium per serving fit into this eating plan.  To find whole grains, look for the word "whole" as the first word in the ingredient list. Shopping  Buy products labeled as "low-sodium" or "no salt added."  Buy fresh foods. Avoid canned foods and premade or frozen meals. Cooking  Avoid adding salt when cooking. Use salt-free seasonings or herbs instead of table salt or sea salt. Check with your health care provider or pharmacist before using salt substitutes.  Do not fry foods. Cook foods using healthy methods such as baking, boiling, grilling, and broiling instead.  Cook with heart-healthy oils, such as olive, canola, soybean, or sunflower oil. Meal planning  Eat a balanced diet that includes: ? 5 or more servings of fruits and vegetables each day. At each meal, try to fill half of your plate with fruits and vegetables. ? Up to 6-8 servings of whole grains each day. ? Less than 6 oz of lean meat, poultry, or fish each day. A 3-oz serving of meat is about the same size as a deck of cards. One egg equals 1 oz. ? 2 servings of low-fat dairy each day. ? A serving of  nuts, seeds, or beans 5 times each week. ? Heart-healthy fats. Healthy fats called Omega-3 fatty acids are found in foods such as flaxseeds and coldwater fish, like sardines, salmon, and mackerel.  Limit how much you eat of the following: ? Canned or prepackaged foods. ? Food that is high in trans fat, such as fried foods. ? Food that is high in saturated fat, such as fatty meat. ? Sweets, desserts, sugary drinks, and other foods with added sugar. ? Full-fat dairy products.  Do not salt foods before eating.  Try to eat at least 2 vegetarian meals each week.  Eat more home-cooked food and less restaurant, buffet, and fast food.  When eating at a restaurant, ask that your food be prepared with less salt or no salt, if possible. What foods are recommended? The items listed may not be a complete list. Talk with your dietitian about what dietary choices are best for you. Grains Whole-grain or whole-wheat bread. Whole-grain or whole-wheat pasta. Brown rice. Orpah Cobb. Bulgur. Whole-grain and low-sodium cereals. Pita bread. Low-fat, low-sodium crackers. Whole-wheat flour tortillas. Vegetables Fresh or frozen vegetables (raw, steamed, roasted, or grilled). Low-sodium or reduced-sodium tomato and vegetable juice. Low-sodium or reduced-sodium tomato sauce and tomato paste. Low-sodium or reduced-sodium canned vegetables. Fruits All fresh, dried, or frozen fruit. Canned fruit in natural juice (without  added sugar). Meat and other protein foods Skinless chicken or [place]. Ground chicken or [place]. Pork with fat trimmed off. Fish and seafood. Egg whites. Dried beans, peas, or lentils. Unsalted nuts, nut butters, and seeds. Unsalted canned beans. Lean cuts of beef with fat trimmed off. Low-sodium, lean deli meat. Dairy Low-fat (1%) or fat-free (skim) milk. Fat-free, low-fat, or reduced-fat cheeses. Nonfat, low-sodium ricotta or cottage cheese. Low-fat or nonfat yogurt. Low-fat, low-sodium  cheese. Fats and oils Soft margarine without trans fats. Vegetable oil. Low-fat, reduced-fat, or light mayonnaise and salad dressings (reduced-sodium). Canola, safflower, olive, soybean, and sunflower oils. Avocado. Seasoning and other foods Herbs. Spices. Seasoning mixes without salt. Unsalted popcorn and pretzels. Fat-free sweets. What foods are not recommended? The items listed may not be a complete list. Talk with your dietitian about what dietary choices are best for you. Grains Baked goods made with fat, such as croissants, muffins, or some breads. Dry pasta or rice meal packs. Vegetables Creamed or fried vegetables. Vegetables in a cheese sauce. Regular canned vegetables (not low-sodium or reduced-sodium). Regular canned tomato sauce and paste (not low-sodium or reduced-sodium). Regular tomato and vegetable juice (not low-sodium or reduced-sodium). Rosita Fire. Olives. Fruits Canned fruit in a light or heavy syrup. Fried fruit. Fruit in cream or butter sauce. Meat and other protein foods Fatty cuts of meat. Ribs. Fried meat. Tomasa Blase. Sausage. Bologna and other processed lunch meats. Salami. Fatback. Hotdogs. Bratwurst. Salted nuts and seeds. Canned beans with added salt. Canned or smoked fish. Whole eggs or egg yolks. Chicken or [place] with skin. Dairy Whole or 2% milk, cream, and half-and-half. Whole or full-fat cream cheese. Whole-fat or sweetened yogurt. Full-fat cheese. Nondairy creamers. Whipped toppings. Processed cheese and cheese spreads. Fats and oils Butter. Stick margarine. Lard. Shortening. Ghee. Bacon fat. Tropical oils, such as coconut, palm kernel, or palm oil. Seasoning and other foods Salted popcorn and pretzels. Onion salt, garlic salt, seasoned salt, table salt, and sea salt. Worcestershire sauce. Tartar sauce. Barbecue sauce. Teriyaki sauce. Soy sauce, including reduced-sodium. Steak sauce. Canned and packaged gravies. Fish sauce. Oyster sauce. Cocktail sauce. Horseradish  that you find on the shelf. Ketchup. Mustard. Meat flavorings and tenderizers. Bouillon cubes. Hot sauce and Tabasco sauce. Premade or packaged marinades. Premade or packaged taco seasonings. Relishes. Regular salad dressings. Where to find more information:  National Heart, Lung, and Blood Institute: PopSteam.is  American Heart Association: www.heart.org Summary  The DASH eating plan is a healthy eating plan that has been shown to reduce high blood pressure (hypertension). It may also reduce your risk for type 2 diabetes, heart disease, and stroke.  With the DASH eating plan, you should limit salt (sodium) intake to 2,300 mg a day. If you have hypertension, you may need to reduce your sodium intake to 1,500 mg a day.  When on the DASH eating plan, aim to eat more fresh fruits and vegetables, whole grains, lean proteins, low-fat dairy, and heart-healthy fats.  Work with your health care provider or diet and nutrition specialist (dietitian) to adjust your eating plan to your individual calorie needs. This information is not intended to replace advice given to you by your health care provider. Make sure you discuss any questions you have with your health care provider. Document Revised: 05/03/2017 Document Reviewed: 05/14/2016 Elsevier Patient Education  2020 [employer].

## 2020-03-10 ENCOUNTER — Encounter (HOSPITAL_COMMUNITY)
Admission: RE | Admit: 2020-03-10 | Discharge: 2020-03-10 | Disposition: A | Discharge status: Home / Self Care | Payer: Commercial Managed Care - PPO | Source: Ambulatory Visit | Attending: Orthopedic Surgery | Admitting: Orthopedic Surgery

## 2020-03-10 ENCOUNTER — Other Ambulatory Visit: Payer: Self-pay

## 2020-03-10 ENCOUNTER — Other Ambulatory Visit (HOSPITAL_COMMUNITY)
Admission: RE | Admit: 2020-03-10 | Discharge: 2020-03-10 | Disposition: A | Discharge status: Home / Self Care | Payer: Commercial Managed Care - PPO | Source: Ambulatory Visit | Attending: Orthopedic Surgery | Admitting: Orthopedic Surgery

## 2020-03-10 DIAGNOSIS — Z20822 Contact with and (suspected) exposure to covid-19: Secondary | ICD-10-CM | POA: Diagnosis not present

## 2020-03-10 DIAGNOSIS — Z01812 Encounter for preprocedural laboratory examination: Secondary | ICD-10-CM | POA: Diagnosis not present

## 2020-03-10 LAB — CBC WITH DIFFERENTIAL/PLATELET
Abs Immature Granulocytes: 0.05 10*3/uL (ref 0.00–0.07)
Basophils Absolute: 0 10*3/uL (ref 0.0–0.1)
Basophils Relative: 1 %
Eosinophils Absolute: 0.4 10*3/uL (ref 0.0–0.5)
Eosinophils Relative: 4 %
HCT: 45.5 % (ref 39.0–52.0)
Hemoglobin: 15.2 g/dL (ref 13.0–17.0)
Immature Granulocytes: 1 %
Lymphocytes Relative: 22 %
Lymphs Abs: 1.9 10*3/uL (ref 0.7–4.0)
MCH: 30.3 pg (ref 26.0–34.0)
MCHC: 33.4 g/dL (ref 30.0–36.0)
MCV: 90.8 fL (ref 80.0–100.0)
Monocytes Absolute: 0.6 10*3/uL (ref 0.1–1.0)
Monocytes Relative: 7 %
Neutro Abs: 5.5 10*3/uL (ref 1.7–7.7)
Neutrophils Relative %: 65 %
Platelets: 193 10*3/uL (ref 150–400)
RBC: 5.01 MIL/uL (ref 4.22–5.81)
RDW: 12.6 % (ref 11.5–15.5)
WBC: 8.5 10*3/uL (ref 4.0–10.5)
nRBC: 0 % (ref 0.0–0.2)

## 2020-03-10 LAB — SARS CORONAVIRUS 2 (TAT 6-24 HRS): SARS Coronavirus 2: NEGATIVE

## 2020-03-10 LAB — BASIC METABOLIC PANEL
Anion gap: 10 (ref 5–15)
BUN: 16 mg/dL (ref 6–20)
CO2: 24 mmol/L (ref 22–32)
Calcium: 9.2 mg/dL (ref 8.9–10.3)
Chloride: 104 mmol/L (ref 98–111)
Creatinine, Ser: 0.86 mg/dL (ref 0.61–1.24)
GFR calc non Af Amer: >60 mL/min (ref >60)
Glucose, Bld: 110 mg/dL — ABNORMAL HIGH (ref 70–99)
Potassium: 4.1 mmol/L (ref 3.5–5.1)
Sodium: 138 mmol/L (ref 135–145)

## 2020-03-10 NOTE — H&P (Signed)
Russell Warren is an 59 y.o. male.   Chief Complaint: Recurrent bleeding left thigh HPI:   59 year old male pitbull bit him left thigh on January 8 he went to the ER on January 12 and then again on the 15th and then again on the 19th.  Since that time his had multiple recurrent hematomas on the medial portion of the thigh requiring aspiration.  He did not have insurance so he could not get the surgery to explore the wound and to find the source of bleeding.  He presents now for evacuation of hematoma exploration left thigh   Past Medical History:  Diagnosis Date  . Eczema   . History of mumps   . History of scarlet fever   . HOH (hard of hearing)   . Meningitis spinal    history of twice age 17 & 72    Past Surgical History:  Procedure Laterality Date  . BACK SURGERY  1999  . DENTAL SURGERY    . MIDDLE EAR SURGERY Bilateral     Family History  Problem Relation Age of Onset  . Heart disease Father   . Diabetes Brother    Social History:  reports that he has never smoked. He has never used smokeless tobacco.  Frequency: 4.00 times per week. Drug: Marijuana. He reports that he does not drink alcohol.  Allergies:  Allergies  Allergen Reactions  . Latex Rash    No medications prior to admission.    Results for orders placed or performed during the hospital encounter of 03/10/20 (from the past 48 hour(s))  Basic metabolic panel     Status: Abnormal   Collection Time: 03/10/20  8:41 AM  Result Value Ref Range   Sodium 138 135 - 145 mmol/L   Potassium 4.1 3.5 - 5.1 mmol/L   Chloride 104 98 - 111 mmol/L   CO2 24 22 - 32 mmol/L   Glucose, Bld 110 (H) 70 - 99 mg/dL    Comment: Glucose reference range applies only to samples taken after fasting for at least 8 hours.   BUN 16 6 - 20 mg/dL   Creatinine, Ser 1.60 0.61 - 1.24 mg/dL   Calcium 9.2 8.9 - 10.9 mg/dL   GFR calc non Af Amer >60 >60 mL/min   Anion gap 10 5 - 15    Comment: Performed at [facility], [address] [place], [place] [address]  CBC WITH DIFFERENTIAL     Status: None   Collection Time: 03/10/20  8:41 AM  Result Value Ref Range   WBC 8.5 4.0 - 10.5 K/uL   RBC 5.01 4.22 - 5.81 MIL/uL   Hemoglobin 15.2 13.0 - 17.0 g/dL   HCT 73.2 39 - 52 %   MCV 90.8 80.0 - 100.0 fL   MCH 30.3 26.0 - 34.0 pg   MCHC 33.4 30.0 - 36.0 g/dL   RDW 20.2 54.2 - 70.6 %   Platelets 193 150 - 400 K/uL   nRBC 0.0 0.0 - 0.2 %   Neutrophils Relative % 65 %   Neutro Abs 5.5 1.7 - 7.7 K/uL   Lymphocytes Relative 22 %   Lymphs Abs 1.9 0.7 - 4.0 K/uL   Monocytes Relative 7 %   Monocytes Absolute 0.6 0.1 - 1.0 K/uL   Eosinophils Relative 4 %   Eosinophils Absolute 0.4 0 - 0 K/uL   Basophils Relative 1 %   Basophils Absolute 0.0 0 - 0 K/uL   Immature Granulocytes 1 %  Abs Immature Granulocytes 0.05 0.00 - 0.07 K/uL    Comment: Performed at [facility], [address], [place], [place] [address]   No results found.  Review of Systems  Constitutional: Negative for chills and fever.  Neurological: Negative for numbness.  All other systems reviewed and are negative.   There were no vitals taken for this visit. Physical Exam   PHYSICAL EXAM SECTION: There were no vitals taken for this visit.  There is no height or weight on file to calculate BMI.   General appearance: Well-developed well-nourished no gross deformities  Eyes clear normal vision no evidence of conjunctivitis or jaundice, extraocular muscles intact  ENT: ears hearing normal, nasal passages clear, throat clear   Lymph nodes: No lymphadenopathy  Neck is supple without palpable mass, full range of motion  Cardiovascular normal pulse and perfusion in all 4 extremities normal color without edema  Neurologically deep tendon reflexes are equal and normal, no sensation loss or deficits no pathologic reflexes  Psychological: Awake alert and oriented x3 mood and affect normal  Skin no lacerations or ulcerations no nodularity no palpable masses,  no erythema or nodularity  Musculoskeletal:   Left thigh mass soft fluctuant neurovascular exam intact left thigh and leg masses 7 x 6   4:44 PM  Assessment/Plan Exploration left thigh mass evacuation hematoma  The procedure has been fully reviewed with the patient; The risks and benefits of surgery have been discussed and explained and understood. Alternative treatment has also been reviewed, questions were encouraged and answered. The postoperative plan is also been reviewed.   Fuller Canada, MD 03/10/2020, 4:42 PM

## 2020-03-11 ENCOUNTER — Ambulatory Visit (HOSPITAL_COMMUNITY)
Admission: RE | Admit: 2020-03-11 | Discharge: 2020-03-11 | Disposition: A | Discharge status: Home / Self Care | Payer: Commercial Managed Care - PPO | Attending: Orthopedic Surgery | Admitting: Orthopedic Surgery

## 2020-03-11 ENCOUNTER — Encounter (HOSPITAL_COMMUNITY)
Admission: RE | Disposition: A | Discharge status: Home / Self Care | Payer: Self-pay | Source: Home / Self Care | Attending: Orthopedic Surgery

## 2020-03-11 ENCOUNTER — Ambulatory Visit (HOSPITAL_COMMUNITY): Payer: Commercial Managed Care - PPO | Admitting: Certified Registered"

## 2020-03-11 ENCOUNTER — Encounter (HOSPITAL_COMMUNITY): Payer: Self-pay | Admitting: Orthopedic Surgery

## 2020-03-11 DIAGNOSIS — S7012XD Contusion of left thigh, subsequent encounter: Secondary | ICD-10-CM | POA: Insufficient documentation

## 2020-03-11 DIAGNOSIS — W540XXD Bitten by dog, subsequent encounter: Secondary | ICD-10-CM | POA: Insufficient documentation

## 2020-03-11 HISTORY — PX: HEMATOMA EVACUATION: SHX5118

## 2020-03-11 SURGERY — EVACUATION HEMATOMA
Anesthesia: General | Site: Thigh | Laterality: Left

## 2020-03-11 MED ORDER — LACTATED RINGERS IV SOLN: INTRAVENOUS | Status: DC

## 2020-03-11 MED ORDER — FENTANYL CITRATE (PF) 100 MCG/2ML IJ SOLN
INTRAMUSCULAR | Status: DC | PRN
Start: 2020-03-11 — End: 2020-03-11
  Administered 2020-03-11: 25 ug via INTRAVENOUS
  Administered 2020-03-11 (×3): 50 ug via INTRAVENOUS
  Administered 2020-03-11: 25 ug via INTRAVENOUS

## 2020-03-11 MED ORDER — BUPIVACAINE-EPINEPHRINE (PF) 0.5% -1:200000 IJ SOLN
INTRAMUSCULAR | Status: DC | PRN
  Administered 2020-03-11: 30 mL

## 2020-03-11 MED ORDER — FENTANYL CITRATE (PF) 100 MCG/2ML IJ SOLN
25.0000 ug | INTRAMUSCULAR | Status: DC | PRN
  Administered 2020-03-11: 50 ug via INTRAVENOUS
  Filled 2020-03-11: qty 2

## 2020-03-11 MED ORDER — CHLORHEXIDINE GLUCONATE 0.12 % MT SOLN
15.0000 mL | Freq: Once | OROMUCOSAL | Status: AC
  Administered 2020-03-11: 15 mL via OROMUCOSAL

## 2020-03-11 MED ORDER — LIDOCAINE 2% (20 MG/ML) 5 ML SYRINGE
INTRAMUSCULAR | Status: AC
  Filled 2020-03-11: qty 5

## 2020-03-11 MED ORDER — FENTANYL CITRATE (PF) 100 MCG/2ML IJ SOLN
INTRAMUSCULAR | Status: AC
  Filled 2020-03-11: qty 2

## 2020-03-11 MED ORDER — MIDAZOLAM HCL 2 MG/2ML IJ SOLN
INTRAMUSCULAR | Status: AC
  Filled 2020-03-11: qty 2

## 2020-03-11 MED ORDER — LIDOCAINE HCL (CARDIAC) PF 100 MG/5ML IV SOSY
PREFILLED_SYRINGE | INTRAVENOUS | Status: DC | PRN
  Administered 2020-03-11: 100 mg via INTRAVENOUS

## 2020-03-11 MED ORDER — ORAL CARE MOUTH RINSE: 15.0000 mL | Freq: Once | OROMUCOSAL | Status: AC

## 2020-03-11 MED ORDER — ROCURONIUM BROMIDE 100 MG/10ML IV SOLN
INTRAVENOUS | Status: DC | PRN
  Administered 2020-03-11: 10 mg via INTRAVENOUS

## 2020-03-11 MED ORDER — DEXMEDETOMIDINE HCL IN NACL 200 MCG/50ML IV SOLN
INTRAVENOUS | Status: DC | PRN
  Administered 2020-03-11: 20 ug via INTRAVENOUS

## 2020-03-11 MED ORDER — ONDANSETRON HCL 4 MG/2ML IJ SOLN
INTRAMUSCULAR | Status: DC | PRN
  Administered 2020-03-11: 4 mg via INTRAVENOUS

## 2020-03-11 MED ORDER — BUPIVACAINE-EPINEPHRINE (PF) 0.5% -1:200000 IJ SOLN
INTRAMUSCULAR | Status: AC
  Filled 2020-03-11: qty 30

## 2020-03-11 MED ORDER — DEXAMETHASONE SODIUM PHOSPHATE 4 MG/ML IJ SOLN
INTRAMUSCULAR | Status: DC | PRN
  Administered 2020-03-11: 10 mg via INTRAVENOUS

## 2020-03-11 MED ORDER — SODIUM CHLORIDE 0.9 % IR SOLN
Status: DC | PRN
  Administered 2020-03-11: 1000 mL

## 2020-03-11 MED ORDER — ROCURONIUM BROMIDE 10 MG/ML (PF) SYRINGE
PREFILLED_SYRINGE | INTRAVENOUS | Status: AC
  Filled 2020-03-11: qty 10

## 2020-03-11 MED ORDER — ONDANSETRON HCL 4 MG/2ML IJ SOLN: 4.0000 mg | Freq: Once | INTRAMUSCULAR | Status: DC | PRN

## 2020-03-11 MED ORDER — MIDAZOLAM HCL 5 MG/5ML IJ SOLN
INTRAMUSCULAR | Status: DC | PRN
  Administered 2020-03-11: 2 mg via INTRAVENOUS

## 2020-03-11 MED ORDER — CEFAZOLIN SODIUM-DEXTROSE 2-4 GM/100ML-% IV SOLN
2.0000 g | INTRAVENOUS | Status: AC
  Administered 2020-03-11: 2 g via INTRAVENOUS
  Filled 2020-03-11: qty 100

## 2020-03-11 MED ORDER — ACETAMINOPHEN-CODEINE #3 300-30 MG PO TABS: 1.0000 | ORAL_TABLET | ORAL | 0 refills | Status: AC | PRN

## 2020-03-11 MED ORDER — DEXMEDETOMIDINE HCL IN NACL 200 MCG/50ML IV SOLN
INTRAVENOUS | Status: AC
  Filled 2020-03-11: qty 50

## 2020-03-11 MED ORDER — ONDANSETRON HCL 4 MG/2ML IJ SOLN
INTRAMUSCULAR | Status: AC
  Filled 2020-03-11: qty 2

## 2020-03-11 MED ORDER — DEXAMETHASONE SODIUM PHOSPHATE 10 MG/ML IJ SOLN
INTRAMUSCULAR | Status: AC
  Filled 2020-03-11: qty 1

## 2020-03-11 MED ORDER — PROPOFOL 10 MG/ML IV BOLUS
INTRAVENOUS | Status: AC
  Filled 2020-03-11: qty 20

## 2020-03-11 MED ORDER — PROPOFOL 10 MG/ML IV BOLUS
INTRAVENOUS | Status: DC | PRN
  Administered 2020-03-11: 200 mg via INTRAVENOUS

## 2020-03-11 SURGICAL SUPPLY — 47 items
APL PRP STRL LF DISP 70% ISPRP (MISCELLANEOUS) ×1
BNDG CMPR STD VLCR NS LF 5.8X6 (GAUZE/BANDAGES/DRESSINGS) ×1
BNDG ELASTIC 6X5.8 VLCR NS LF (GAUZE/BANDAGES/DRESSINGS) ×1 IMPLANT
BNDG GAUZE ELAST 4 BULKY (GAUZE/BANDAGES/DRESSINGS) ×1 IMPLANT
CHLORAPREP W/TINT 26 (MISCELLANEOUS) ×2 IMPLANT
CLOTH BEACON ORANGE TIMEOUT ST (SAFETY) ×2 IMPLANT
COVER LIGHT HANDLE STERIS (MISCELLANEOUS) ×4 IMPLANT
COVER WAND RF STERILE (DRAPES) ×2 IMPLANT
CUFF TOURN SGL QUICK 34 (TOURNIQUET CUFF) ×2
CUFF TRNQT CYL 34X4.125X (TOURNIQUET CUFF) IMPLANT
ELECT REM PT RETURN 9FT ADLT (ELECTROSURGICAL) ×2
ELECTRODE REM PT RTRN 9FT ADLT (ELECTROSURGICAL) ×1 IMPLANT
GAUZE SPONGE 4X4 12PLY STRL (GAUZE/BANDAGES/DRESSINGS) ×1 IMPLANT
GAUZE XEROFORM 5X9 LF (GAUZE/BANDAGES/DRESSINGS) ×1 IMPLANT
GLOVE BIOGEL PI IND STRL 7.0 (GLOVE) ×1 IMPLANT
GLOVE BIOGEL PI INDICATOR 7.0 (GLOVE) ×2
GLOVE SKINSENSE NS SZ8.0 LF (GLOVE) ×1
GLOVE SKINSENSE STRL SZ8.0 LF (GLOVE) ×1 IMPLANT
GLOVE SS N UNI LF 8.5 STRL (GLOVE) ×2 IMPLANT
GLOVE SURG SS PI 7.0 STRL IVOR (GLOVE) ×2 IMPLANT
GOWN STRL REUS W/TWL LRG LVL3 (GOWN DISPOSABLE) ×2 IMPLANT
GOWN STRL REUS W/TWL XL LVL3 (GOWN DISPOSABLE) ×2 IMPLANT
HANDPIECE INTERPULSE COAX TIP (DISPOSABLE) ×2
INST SET MINOR BONE (KITS) ×2 IMPLANT
KIT TURNOVER KIT A (KITS) ×2 IMPLANT
MANIFOLD NEPTUNE II (INSTRUMENTS) ×2 IMPLANT
NDL HYPO 21X1.5 SAFETY (NEEDLE) IMPLANT
NEEDLE HYPO 21X1.5 SAFETY (NEEDLE) ×2 IMPLANT
NS IRRIG 1000ML POUR BTL (IV SOLUTION) ×2 IMPLANT
PACK BASIC LIMB (CUSTOM PROCEDURE TRAY) ×1 IMPLANT
PAD ABD 5X9 TENDERSORB (GAUZE/BANDAGES/DRESSINGS) ×1 IMPLANT
PAD ARMBOARD 7.5X6 YLW CONV (MISCELLANEOUS) ×2 IMPLANT
PAD CAST 4YDX4 CTTN HI CHSV (CAST SUPPLIES) ×1 IMPLANT
PADDING CAST COTTON 4X4 STRL (CAST SUPPLIES) ×2
SET BASIN LINEN APH (SET/KITS/TRAYS/PACK) ×2 IMPLANT
SET HNDPC FAN SPRY TIP SCT (DISPOSABLE) ×1 IMPLANT
SPONGE LAP 18X18 RF (DISPOSABLE) ×2 IMPLANT
STAPLER VISISTAT 35W (STAPLE) ×1 IMPLANT
SUT MON AB 0 CT1 (SUTURE) ×1 IMPLANT
SUT MON AB 2-0 SH 27 (SUTURE) ×2
SUT MON AB 2-0 SH27 (SUTURE) IMPLANT
SUT SILK 2 0 (SUTURE) ×2
SUT SILK 2-0 18XBRD TIE 12 (SUTURE) IMPLANT
SYR 30ML LL (SYRINGE) ×1 IMPLANT
SYR BULB IRRIG 60ML STRL (SYRINGE) ×3 IMPLANT
SYR CONTROL 10ML LL (SYRINGE) ×1 IMPLANT
YANKAUER SUCT 12FT TUBE ARGYLE (SUCTIONS) ×2 IMPLANT

## 2020-03-11 NOTE — Brief Op Note (Signed)
03/11/2020  8:49 AM  PATIENT:  Russell Warren  59 y.o. male  PRE-OPERATIVE DIAGNOSIS:  Left Thigh Hematoma  POST-OPERATIVE DIAGNOSIS:  Left Thigh Hematoma  PROCEDURE:  Procedure(s): EXCISION OF LEFT THIGH HEMATOMA (Left)  Findings at surgery: 6 x 4 cm encapsulated hematoma in the deep subcutaneous tissue above the fascial layer of the muscle with a rent in the fascia of the abductors and one small arterial feeding vessel attached to a branch of the local nervous tissue  I open the encapsulated mass and there was a large blood vessel and multiple clotted blood vessels inside the mass no foreign body   Wound class clean   No assistants   General anesthesia   Specimen was sent to pathology   SURGEON:  Surgeon(s) and Role:    Vickki Hearing, MD - Primary   EBL:   10cc  BLOOD ADMINISTERED:none  DRAINS: none   LOCAL MEDICATIONS USED: 30 cc of Marcaine with epinephrine  COUNTS:  YES  TOURNIQUET:  * Missing tourniquet times found for documented tourniquets in log: [id number] *  DICTATION: .Reubin Milan Dictation  PLAN OF CARE: Discharge to home after PACU  PATIENT DISPOSITION:  PACU - hemodynamically stable.   Delay start of Pharmacological VTE agent (>24hrs) due to surgical blood loss or risk of bleeding: not applicable

## 2020-03-11 NOTE — Anesthesia Preprocedure Evaluation (Signed)
Anesthesia Evaluation  Patient identified by MRN, date of birth, ID band Patient awake    Reviewed: Allergy & Precautions, H&P , NPO status , Patient's Chart, lab work & pertinent test results, reviewed documented beta blocker date and time   Airway Mallampati: II  TM Distance: >3 FB Neck ROM: full    Dental no notable dental hx. (+) Edentulous Upper, Edentulous Lower   Pulmonary neg pulmonary ROS,    Pulmonary exam normal breath sounds clear to auscultation       Cardiovascular Exercise Tolerance: Good negative cardio ROS   Rhythm:regular Rate:Normal     Neuro/Psych negative neurological ROS  negative psych ROS   GI/Hepatic negative GI ROS, Neg liver ROS,   Endo/Other  negative endocrine ROS  Renal/GU negative Renal ROS  negative genitourinary   Musculoskeletal   Abdominal   Peds  Hematology negative hematology ROS (+)   Anesthesia Other Findings   Reproductive/Obstetrics negative OB ROS                             Anesthesia Physical Anesthesia Plan  ASA: II  Anesthesia Plan: General   Post-op Pain Management:    Induction:   PONV Risk Score and Plan: Ondansetron  Airway Management Planned:   Additional Equipment:   Intra-op Plan:   Post-operative Plan:   Informed Consent: I have reviewed the patients History and Physical, chart, labs and discussed the procedure including the risks, benefits and alternatives for the proposed anesthesia with the patient or authorized representative who has indicated his/her understanding and acceptance.     Dental Advisory Given  Plan Discussed with: CRNA  Anesthesia Plan Comments:         Anesthesia Quick Evaluation

## 2020-03-11 NOTE — Interval H&P Note (Signed)
History and Physical Interval Note:  03/11/2020 7:26 AM  Russell Warren  has presented today for surgery, with the diagnosis of Left Thigh Hematoma.  The various methods of treatment have been discussed with the patient and family. After consideration of risks, benefits and other options for treatment, the patient has consented to  Procedure(s): LEFT THIGH HEMATOMA INCISION AND DRAINAGE (Left) as a surgical intervention.  The patient's history has been reviewed, patient examined, no change in status, stable for surgery.  I have reviewed the patient's chart and labs.  Questions were answered to the patient's satisfaction.     Fuller Canada

## 2020-03-11 NOTE — Anesthesia Procedure Notes (Signed)
Procedure Name: LMA Insertion Performed by: Brynda Peon, CRNA Pre-anesthesia Checklist: Patient identified, Emergency Drugs available, Suction available, Patient being monitored and Timeout performed Patient Re-evaluated:Patient Re-evaluated prior to induction Oxygen Delivery Method: Circle system utilized Induction Type: IV induction LMA: LMA inserted LMA Size: 5.0 Number of attempts: 1 Placement Confirmation: positive ETCO2,  CO2 detector and breath sounds checked- equal and bilateral Tube secured with: Tape Dental Injury: Teeth and Oropharynx as per pre-operative assessment

## 2020-03-11 NOTE — Op Note (Signed)
03/11/2020  8:49 AM  PATIENT:  Russell Warren  59 y.o. male  PRE-OPERATIVE DIAGNOSIS:  Left Thigh Hematoma  POST-OPERATIVE DIAGNOSIS:  Left Thigh Hematoma  PROCEDURE:  Procedure(s): EXCISION OF LEFT THIGH HEMATOMA (Left)-27301 Findings at surgery: 6 x 4 cm encapsulated hematoma deep to the subcutaneous tissue above the fascial layer of the muscle with a rent in the fascia of the abductors and one small arterial feeding vessel attached to a branch of the local nervous tissue  I open the encapsulated mass and there was a large blood vessel and multiple clotted blood vessels inside the mass no foreign body   Wound class clean   No assistants   General anesthesia   Specimen was sent to pathology   The surgery was done in the following manner  The patient was seen in preop the left thigh mass was marked surgical site confirmed chart review completed.  Patient taken to the operating for general anesthesia.  The left leg was prepped and draped sterilely  Timeout was completed  The incision was made over the mass in a longitudinal direction extending it proximally and distally to the mass to obtain normal tissue planes.  The subcutaneous tissue was divided and cauterized as needed to control bleeding.  Periosteal elevator was used to sharply remove the tissue from over the mass which was firm.  The mass was above the fascia and therefore tissue plane was established and the mass was removed.  Upon removal of the mass there was a small vessel attached to a nerve as we noted cauterizing the vessel caused violent jump in the musculature.  Therefore we dissected the small vessel and tied it with 2-0 silk ties.  The vessels were feeding the mass and hematoma.  There was a rent in the fascia of the medial musculature.  After irrigation we closed with 0 Monocryl interrupted suture followed by 2-0 Monocryl running suture skin staples and then we injected 30 cc of Marcaine with  epinephrine  Sterile dressings were applied  Patient was extubated taken to recovery room in stable condition  SURGEON:  Surgeon(s) and Role:    Vickki Hearing, MD - Primary   EBL:   10cc  BLOOD ADMINISTERED:none  DRAINS: none   LOCAL MEDICATIONS USED: 30 cc of Marcaine with epinephrine  COUNTS:  YES  TOURNIQUET:  * Missing tourniquet times found for documented tourniquets in log: [id number] *  DICTATION: .Dragon Dictation  PLAN OF CARE: Discharge to home after PACU  PATIENT DISPOSITION:  PACU - hemodynamically stable.   Delay start of Pharmacological VTE agent (>24hrs) due to surgical blood loss or risk of bleeding: not applicable

## 2020-03-11 NOTE — Transfer of Care (Signed)
Immediate Anesthesia Transfer of Care Note  Patient: Russell Warren  Procedure(s) Performed: EXCISION OF LEFT THIGH HEMATOMA (Left Thigh)  Patient Location: PACU  Anesthesia Type:General  Level of Consciousness: awake, alert , oriented and patient cooperative  Airway & Oxygen Therapy: Patient Spontanous Breathing  Post-op Assessment: Report given to RN, Post -op Vital signs reviewed and stable and Patient moving all extremities  Post vital signs: Reviewed and stable  Last Vitals:  Vitals Value Taken Time  BP    Temp    Pulse 70 03/11/20 0849  Resp 10 03/11/20 0849  SpO2 94 % 03/11/20 0849  Vitals shown include unvalidated device data.  Last Pain:  Vitals:   03/11/20 0644  TempSrc: Oral  PainSc: 6       Patients Stated Pain Goal: 5 (03/11/20 5747)  Complications: No complications documented.

## 2020-03-11 NOTE — Anesthesia Postprocedure Evaluation (Signed)
Anesthesia Post Note  Patient: Loleta Chance  Procedure(s) Performed: EXCISION OF LEFT THIGH HEMATOMA (Left Thigh)  Patient location during evaluation: PACU Anesthesia Type: General Level of consciousness: awake, oriented and awake and alert Pain management: pain level controlled Vital Signs Assessment: post-procedure vital signs reviewed and stable Respiratory status: spontaneous breathing, respiratory function stable and nonlabored ventilation Cardiovascular status: blood pressure returned to baseline and stable Postop Assessment: no backache and no headache Anesthetic complications: no   No complications documented.   Last Vitals:  Vitals:   03/11/20 0644  BP: (!) 130/102  Resp: 16  Temp: 36.6 C  SpO2: 98%    Last Pain:  Vitals:   03/11/20 0644  TempSrc: Oral  PainSc: 6                  Brynda Peon

## 2020-03-14 ENCOUNTER — Encounter (HOSPITAL_COMMUNITY): Payer: Self-pay | Admitting: Orthopedic Surgery

## 2020-03-14 LAB — SURGICAL PATHOLOGY

## 2020-03-16 ENCOUNTER — Encounter: Payer: Self-pay | Admitting: Orthopedic Surgery

## 2020-03-16 ENCOUNTER — Ambulatory Visit (INDEPENDENT_AMBULATORY_CARE_PROVIDER_SITE_OTHER): Payer: Commercial Managed Care - PPO | Admitting: Orthopedic Surgery

## 2020-03-16 ENCOUNTER — Other Ambulatory Visit: Payer: Self-pay

## 2020-03-16 DIAGNOSIS — Z9889 Other specified postprocedural states: Secondary | ICD-10-CM | POA: Insufficient documentation

## 2020-03-16 NOTE — Patient Instructions (Signed)
Change dressing as needed

## 2020-03-16 NOTE — Progress Notes (Signed)
Chief Complaint  Patient presents with  . Routine Post Op    03/11/20 left thigh hematoma    Wound check POD 3  Path report came back organizing hematoma  Other than some bruising his wound looks good  Staples out in a week  Encounter Diagnosis  Name Primary?  . S/P evacuation of hematoma left thigh 03/11/20 Yes

## 2020-03-17 ENCOUNTER — Other Ambulatory Visit: Payer: Self-pay | Admitting: Orthopedic Surgery

## 2020-03-17 MED ORDER — ACETAMINOPHEN-CODEINE #3 300-30 MG PO TABS: 1.0000 | ORAL_TABLET | Freq: Four times a day (QID) | ORAL | 0 refills | Status: DC | PRN

## 2020-03-17 NOTE — Telephone Encounter (Signed)
Patient called to request refill:  acetaminophen-codeine (TYLENOL #3) 300-30 MG tablet 30 tablet  -[facility], [place]

## 2020-03-21 ENCOUNTER — Other Ambulatory Visit: Payer: Self-pay

## 2020-03-21 ENCOUNTER — Ambulatory Visit (INDEPENDENT_AMBULATORY_CARE_PROVIDER_SITE_OTHER): Payer: Commercial Managed Care - PPO | Admitting: Orthopedic Surgery

## 2020-03-21 ENCOUNTER — Other Ambulatory Visit: Payer: Self-pay | Admitting: Radiology

## 2020-03-21 ENCOUNTER — Telehealth: Payer: Self-pay | Admitting: Radiology

## 2020-03-21 ENCOUNTER — Encounter: Payer: Self-pay | Admitting: Orthopedic Surgery

## 2020-03-21 DIAGNOSIS — Z9889 Other specified postprocedural states: Secondary | ICD-10-CM

## 2020-03-21 MED ORDER — ACETAMINOPHEN-CODEINE #3 300-30 MG PO TABS
1.0000 | ORAL_TABLET | Freq: Four times a day (QID) | ORAL | 0 refills | Status: DC | PRN
Start: 2020-03-21 — End: 2020-03-24

## 2020-03-21 NOTE — Progress Notes (Signed)
Chief Complaint  Patient presents with  . Post-op Problem    swelling left thigh s/p hematoma surgery 03/11/20     59 year old male postop day 10 evacuation of hematoma and mass left thigh cough that his leg was swollen so we brought him in concerned about DVT but actually the swelling is at the side of the incision with no surrounding erythema  Recommend keeping his appointment Thursday to have the staples taken out

## 2020-03-21 NOTE — Telephone Encounter (Signed)
Patient called, thigh is swollen, per Dr Romeo Apple work in now.  Patient is on his way here now.

## 2020-03-21 NOTE — Telephone Encounter (Signed)
-----   Message from Doristine Section sent at 03/18/2020 11:29 AM EDT ----- Regarding: Question,"pend" Briawna Carver,  RE: Russell Warren #[id number] prescription refill which Dr Romeo Apple had done yesterday, 03/17/20. Is something pended on it?  Patient said they didn't yet notify him that it is ready?

## 2020-03-24 ENCOUNTER — Encounter: Payer: Self-pay | Admitting: Orthopedic Surgery

## 2020-03-24 ENCOUNTER — Other Ambulatory Visit: Payer: Self-pay

## 2020-03-24 ENCOUNTER — Ambulatory Visit (INDEPENDENT_AMBULATORY_CARE_PROVIDER_SITE_OTHER): Payer: Commercial Managed Care - PPO | Admitting: Orthopedic Surgery

## 2020-03-24 DIAGNOSIS — Z9889 Other specified postprocedural states: Secondary | ICD-10-CM

## 2020-03-24 MED ORDER — IBUPROFEN 800 MG PO TABS: 800.0000 mg | ORAL_TABLET | Freq: Three times a day (TID) | ORAL | 0 refills | Status: DC | PRN

## 2020-03-24 MED ORDER — ACETAMINOPHEN-CODEINE #3 300-30 MG PO TABS
1.0000 | ORAL_TABLET | Freq: Four times a day (QID) | ORAL | 0 refills | Status: DC | PRN
Start: 2020-03-24 — End: 2020-05-19

## 2020-03-24 NOTE — Progress Notes (Signed)
Chief Complaint  Patient presents with  . Routine Post Op    hematoma left thigh 03/11/20    Encounter Diagnosis  Name Primary?  . S/P evacuation of hematoma left thigh 03/11/20 Yes    Postop visit status post evacuation of evolving hematoma left thigh patient was concerned that he had some swelling at the incision and came in about 2 days ago.  There was some fluid in the area of the surgery but nothing of concern  He's here  for routine postop visit to get his staples out.  Swelling thigh, suture line intact staples extracted   Fu 2 weeks   Meds ordered this encounter  Medications  . acetaminophen-codeine (TYLENOL #3) 300-30 MG tablet    Sig: Take 1 tablet by mouth every 6 (six) hours as needed for moderate pain.    Dispense:  30 tablet    Refill:  0  . ibuprofen (ADVIL) 800 MG tablet    Sig: Take 1 tablet (800 mg total) by mouth every 8 (eight) hours as needed.    Dispense:  30 tablet    Refill:  0

## 2020-03-24 NOTE — Patient Instructions (Signed)
OOW X 2 WEEKS   TAKE IT EASY X 2 WEEKS

## 2020-03-28 ENCOUNTER — Telehealth: Payer: Self-pay | Admitting: Orthopedic Surgery

## 2020-03-28 NOTE — Telephone Encounter (Signed)
Patient called and was reporting more swelling in the leg and he reports it was elevated. He was advised to go to ER if his leg was worse before the appointment and he voices understanding. No other concerns.

## 2020-03-30 ENCOUNTER — Ambulatory Visit (INDEPENDENT_AMBULATORY_CARE_PROVIDER_SITE_OTHER): Payer: Commercial Managed Care - PPO | Admitting: Orthopedic Surgery

## 2020-03-30 ENCOUNTER — Encounter: Payer: Self-pay | Admitting: Orthopedic Surgery

## 2020-03-30 ENCOUNTER — Other Ambulatory Visit: Payer: Self-pay

## 2020-03-30 VITALS — BP 143/100 | HR 72 | Ht 71.5 in | Wt 236.0 lb

## 2020-03-30 DIAGNOSIS — T148XXA Other injury of unspecified body region, initial encounter: Secondary | ICD-10-CM

## 2020-03-30 DIAGNOSIS — Z9889 Other specified postprocedural states: Secondary | ICD-10-CM

## 2020-03-30 NOTE — Progress Notes (Signed)
Postop note status post hematoma evacuation left thigh Encounter Diagnoses  Name Primary?   Hematoma Yes   S/P evacuation of hematoma left thigh 03/11/20     Surgery October 8  Staples removed October 21  He was concerned with swelling   I dont see any signs of infection just post op seroma vs hematoma   F/u 4 nov as sched

## 2020-04-07 ENCOUNTER — Other Ambulatory Visit: Payer: Self-pay

## 2020-04-07 ENCOUNTER — Ambulatory Visit (INDEPENDENT_AMBULATORY_CARE_PROVIDER_SITE_OTHER): Payer: Commercial Managed Care - PPO | Admitting: Orthopedic Surgery

## 2020-04-07 ENCOUNTER — Encounter: Payer: Self-pay | Admitting: Orthopedic Surgery

## 2020-04-07 DIAGNOSIS — Z9889 Other specified postprocedural states: Secondary | ICD-10-CM

## 2020-04-07 NOTE — Patient Instructions (Addendum)
RET TO WORK MON NO RESTRICTIONS  ICE AFTER WORK  BIKER SHORT UNDER WORK CLOTHES   Soap water neosporin to the wound area

## 2020-04-07 NOTE — Progress Notes (Signed)
Chief Complaint  Patient presents with  . Routine Post Op    03/11/20 left thigh hematoma    59 year old male had a left eye hematoma removed comes in today with less pain less swelling exam shows   Looks good except for one area at the distal incision but the swelling seroma is going down    He also has a knee injury from before seems like he injured his MCL  He has grade 1 laxity with firm endpoint at 30 degrees indicating MCL old injury  Encounter Diagnosis  Name Primary?  . S/P evacuation of hematoma left thigh 03/11/20 Yes   Everything looks good he can go back to work probably Monday may have to change that but he should use ice after work he is a compression short to keep the swelling down come back as needed  Note at the end of the visit he showed me to place at the end of the incision where it is a little open so I told new soap and water and Neosporin keep it covered till it heals no antibiotics needed

## 2020-04-11 ENCOUNTER — Telehealth: Payer: Self-pay | Admitting: Orthopedic Surgery

## 2020-04-11 ENCOUNTER — Encounter: Payer: Self-pay | Admitting: Orthopedic Surgery

## 2020-04-11 NOTE — Telephone Encounter (Signed)
Letter has been updated.  Patient is aware and did not have any questions.   Russell Warren, the patient needs a week extension for his FMLA since Dr. Romeo Apple is writing him out anther week. Please and thanks.

## 2020-04-11 NOTE — Telephone Encounter (Signed)
Please advise 

## 2020-04-11 NOTE — Telephone Encounter (Signed)
Called back to patient; aware of letter and of the update being forwarded to [employer]; authorization on file.

## 2020-04-11 NOTE — Telephone Encounter (Signed)
Yes that's fine 

## 2020-04-11 NOTE — Telephone Encounter (Signed)
Patient called and wants to extend his work note to next Monday 04/18/20.  Leg is doing better each day and he still has alittle bit of swelling and he is afraid if he goes back now he will mess up what Dr. Romeo Apple has done.    Please call the patient back at [phone]

## 2020-05-19 ENCOUNTER — Encounter: Payer: Self-pay | Admitting: Orthopedic Surgery

## 2020-05-19 ENCOUNTER — Other Ambulatory Visit: Payer: Self-pay

## 2020-05-19 ENCOUNTER — Ambulatory Visit (INDEPENDENT_AMBULATORY_CARE_PROVIDER_SITE_OTHER): Payer: Commercial Managed Care - PPO | Admitting: Orthopedic Surgery

## 2020-05-19 DIAGNOSIS — Z9889 Other specified postprocedural states: Secondary | ICD-10-CM

## 2020-05-19 MED ORDER — IBUPROFEN 800 MG PO TABS: 800.0000 mg | ORAL_TABLET | Freq: Three times a day (TID) | ORAL | 0 refills | Status: DC | PRN

## 2020-05-19 MED ORDER — ACETAMINOPHEN-CODEINE #3 300-30 MG PO TABS: 1.0000 | ORAL_TABLET | Freq: Two times a day (BID) | ORAL | 0 refills | Status: AC

## 2020-05-19 NOTE — Patient Instructions (Addendum)
Work note for today  Okay to go to work tomorrow  Come back if swelling returns.

## 2020-05-19 NOTE — Progress Notes (Signed)
Chief Complaint  Patient presents with  . Post-op Follow-up    03/11/20 left thigh hematoma evacuation was improving but swelling increased over last week and a half     Status post evacuation of hematoma after a dog bite, patient says the swelling has reaccumulated  His left thigh incision shows no signs of infection he has a large firm mass in the area of the prior surgery so we will aspirate it.  20 cc of dark blood aspirated  I think going forward further imaging studies and vascular consult will be necessary before entertaining any further surgery  Meds ordered this encounter  Medications  . ibuprofen (ADVIL) 800 MG tablet    Sig: Take 1 tablet (800 mg total) by mouth every 8 (eight) hours as needed.    Dispense:  30 tablet    Refill:  0  . acetaminophen-codeine (TYLENOL #3) 300-30 MG tablet    Sig: Take 1 tablet by mouth 2 (two) times daily for 5 days.    Dispense:  10 tablet    Refill:  0

## 2020-06-06 ENCOUNTER — Ambulatory Visit: Payer: Commercial Managed Care - PPO | Admitting: Orthopedic Surgery

## 2020-06-16 ENCOUNTER — Ambulatory Visit (INDEPENDENT_AMBULATORY_CARE_PROVIDER_SITE_OTHER): Payer: Commercial Managed Care - PPO | Admitting: Orthopedic Surgery

## 2020-06-16 ENCOUNTER — Encounter: Payer: Self-pay | Admitting: Orthopedic Surgery

## 2020-06-16 ENCOUNTER — Other Ambulatory Visit: Payer: Self-pay

## 2020-06-16 VITALS — BP 159/105 | HR 69 | Ht 71.5 in | Wt 236.0 lb

## 2020-06-16 DIAGNOSIS — T148XXA Other injury of unspecified body region, initial encounter: Secondary | ICD-10-CM | POA: Diagnosis not present

## 2020-06-16 DIAGNOSIS — W540XXD Bitten by dog, subsequent encounter: Secondary | ICD-10-CM | POA: Diagnosis not present

## 2020-06-16 NOTE — Progress Notes (Signed)
Chief Complaint  Patient presents with  . Post-op Follow-up    Thigh left 03/11/20    Male status post evacuation of left thigh hematoma which was caused by dog bite.  Date of surgery March 11, 2020  Has now noticed recurrent swelling.  On December 16 he had 20 cc of dark blood aspirated  He comes in today complaining that the swelling has returned  I think at this point we should do an MRI with and without contrast get a vascular consult to see if we need to do some type of groin exploration to find the feeder vessel.  No aspiration was done today  Follow-up after MRI

## 2020-06-18 LAB — BASIC METABOLIC PANEL
BUN: 17 mg/dL (ref 7–25)
CO2: 28 mmol/L (ref 20–32)
Calcium: 9.6 mg/dL (ref 8.6–10.3)
Chloride: 105 mmol/L (ref 98–110)
Creat: 0.97 mg/dL (ref 0.70–1.33)
Glucose, Bld: 95 mg/dL (ref 65–139)
Potassium: 4.2 mmol/L (ref 3.5–5.3)
Sodium: 140 mmol/L (ref 135–146)

## 2020-07-04 ENCOUNTER — Ambulatory Visit (HOSPITAL_COMMUNITY)
Admission: RE | Admit: 2020-07-04 | Discharge: 2020-07-04 | Disposition: A | Discharge status: Home / Self Care | Payer: Commercial Managed Care - PPO | Source: Ambulatory Visit | Attending: Orthopedic Surgery | Admitting: Orthopedic Surgery

## 2020-07-04 ENCOUNTER — Other Ambulatory Visit: Payer: Self-pay

## 2020-07-04 DIAGNOSIS — W540XXD Bitten by dog, subsequent encounter: Secondary | ICD-10-CM

## 2020-07-04 DIAGNOSIS — S76892S Other injury of other specified muscles, fascia and tendons at thigh level, left thigh, sequela: Secondary | ICD-10-CM | POA: Insufficient documentation

## 2020-07-04 DIAGNOSIS — T148XXA Other injury of unspecified body region, initial encounter: Secondary | ICD-10-CM

## 2020-07-04 DIAGNOSIS — S7012XS Contusion of left thigh, sequela: Secondary | ICD-10-CM | POA: Insufficient documentation

## 2020-07-04 DIAGNOSIS — W540XXS Bitten by dog, sequela: Secondary | ICD-10-CM | POA: Diagnosis not present

## 2020-07-04 MED ORDER — GADOBUTROL 1 MMOL/ML IV SOLN
10.0000 mL | Freq: Once | INTRAVENOUS | Status: AC | PRN
  Administered 2020-07-04: 10 mL via INTRAVENOUS

## 2020-07-11 ENCOUNTER — Encounter: Payer: Self-pay | Admitting: Orthopedic Surgery

## 2020-07-11 ENCOUNTER — Other Ambulatory Visit: Payer: Self-pay

## 2020-07-11 ENCOUNTER — Ambulatory Visit (INDEPENDENT_AMBULATORY_CARE_PROVIDER_SITE_OTHER): Payer: Commercial Managed Care - PPO | Admitting: Orthopedic Surgery

## 2020-07-11 VITALS — BP 127/90 | HR 88 | Ht 71.5 in | Wt 236.0 lb

## 2020-07-11 DIAGNOSIS — T148XXA Other injury of unspecified body region, initial encounter: Secondary | ICD-10-CM

## 2020-07-11 NOTE — Progress Notes (Signed)
MRI RESULTS FOLLOW UP   No diagnosis found.  Chief Complaint  Patient presents with  . hematoma    Left thigh injury 06/07/19 I and D 03/11/20 still has hematoma     60 year old male had a dog back to his thigh he developed multiple bloody collections which were drained but were unsuccessful in relieving the hematoma.  He underwent surgery but the hematoma has returned.  He went for MRI to evaluate the mass further.  He says his pain is much less and the mass seems to be going down   + EXAM FINDINGS: Small mass medial thigh nontender no erythema MY READING OF THE MRI the mass is superficial to the musculature of the medial thigh does not appear to have any vascular connections to it    MRI REPORT:   CONTRAST:  11mL GADAVIST GADOBUTROL 1 MMOL/ML IV SOLN   COMPARISON:  None.   FINDINGS: Bones/Joint/Cartilage   Unremarkable   Ligaments   N/A   Muscles and Tendons   Trace fluid signal intensity and mild enhancement tracking down the superficial margin of the vastus lateralis muscle proximally as shown on images 18-31 of series 7, measuring about 0.8 by 0.3 by 8.0 cm. This appears to correspond with a small defect in the vastus lateralis tendon.   Soft tissues   Anteromedial to the left sartorius muscle at the junction of the mid and distal femur level, a 3.8 by 2.1 by 4.1 cm (volume = 17 cm^3) lesion with high T2 and high central T1 signal and a peripheral rind measuring up to 0.4 cm in thickness is identified, with a moderate amount of surrounding inflammatory stranding and enhancement in the subcutaneous tissues. A small amount of this inflammatory stranding may extend between the vastus medialis and sartorius muscles. The lesion abuts the superficial fascia margin. No internal gas density is identified. I not see a well-defined foreign body such as canine tooth, although correlation with radiography may be warranted in this regard.   IMPRESSION: 1. 17 cubic cm  complex fluid collection with surrounding inflammation and enhancement in the subcutaneous tissues of the left medial thigh, with a high T2 signal rim probably representing hemosiderin. Fluid content inside this lesion is likely proteinaceous. Although there is surrounding subcutaneous inflammation and enhancement, this probably represents a complex proteinaceous fluid collection rather than a true abscess. Correlate with any fluctuance/fever. I not see a definite retained foreign body, although if not already obtained, consider conventional radiographs for complimentary assessment. 2. Small band of fluid signal intensity and mild enhancement tracking down the superficial margin of the vastus lateralis muscle proximally, measuring about 0.8 by 0.3 by 8.0 cm. This probably represents a injury involving the proximal vastus lateralis tendon.     Electronically Signed   By: Gaylyn Rong M.D.   On: 07/05/2020 09:02   ASSESSMENT AND PLAN :   He agrees to sit on it for now since it is going down and the pain is much less

## 2020-07-19 ENCOUNTER — Encounter: Payer: Commercial Managed Care - PPO | Admitting: Vascular Surgery

## 2020-08-08 ENCOUNTER — Encounter (INDEPENDENT_AMBULATORY_CARE_PROVIDER_SITE_OTHER): Payer: Self-pay | Admitting: *Deleted

## 2020-08-26 ENCOUNTER — Ambulatory Visit: Payer: Commercial Managed Care - PPO | Admitting: Cardiology

## 2020-09-27 ENCOUNTER — Other Ambulatory Visit: Payer: Self-pay

## 2020-09-27 ENCOUNTER — Ambulatory Visit
Admission: EM | Admit: 2020-09-27 | Discharge: 2020-09-27 | Disposition: A | Discharge status: Home / Self Care | Payer: Commercial Managed Care - PPO | Attending: Emergency Medicine | Admitting: Emergency Medicine

## 2020-09-27 ENCOUNTER — Encounter: Payer: Self-pay | Admitting: Emergency Medicine

## 2020-09-27 DIAGNOSIS — L03311 Cellulitis of abdominal wall: Secondary | ICD-10-CM

## 2020-09-27 DIAGNOSIS — S30861A Insect bite (nonvenomous) of abdominal wall, initial encounter: Secondary | ICD-10-CM | POA: Diagnosis not present

## 2020-09-27 DIAGNOSIS — W57XXXA Bitten or stung by nonvenomous insect and other nonvenomous arthropods, initial encounter: Secondary | ICD-10-CM | POA: Diagnosis not present

## 2020-09-27 MED ORDER — DOXYCYCLINE HYCLATE 100 MG PO CAPS: 100.0000 mg | ORAL_CAPSULE | Freq: Two times a day (BID) | ORAL | 0 refills | Status: DC

## 2020-09-27 MED ORDER — MUPIROCIN 2 % EX OINT: 1.0000 "application " | TOPICAL_OINTMENT | Freq: Two times a day (BID) | CUTANEOUS | 0 refills | Status: DC

## 2020-09-27 NOTE — ED Triage Notes (Signed)
3 red areas on stomach since Wednesday of last week.  questionable insect bites

## 2020-09-27 NOTE — ED Provider Notes (Signed)
[facility]   [id number] 09/27/20 Arrival Time: 1624   CC: Insect bites  SUBJECTIVE:  Russell Warren is a 60 y.o. male who presents with a possible insect bites on his abdomen that occurred over the past week.  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 Describes as red, swollen and painful.  Has tried neosporin and peroxide with minimal relief.  Denies previous symptoms in the past.  Denies fever, chills, nausea, vomiting, abdominal pain.  ROS: As per HPI.  All other pertinent ROS negative.     Past Medical History:  Diagnosis Date  . Eczema   . History of mumps   . History of scarlet fever   . HOH (hard of hearing)   . Meningitis spinal    history of twice age 23 & 48   Past Surgical History:  Procedure Laterality Date  . BACK SURGERY  1999  . DENTAL SURGERY    . HEMATOMA EVACUATION Left 03/11/2020   Procedure: EXCISION OF LEFT THIGH HEMATOMA;  Surgeon: Vickki Hearing, MD;  Location: [facility];  Service: Orthopedics;  Laterality: Left;  Marland Kitchen MIDDLE EAR SURGERY Bilateral    Allergies  Allergen Reactions  . Latex Rash   No current facility-administered medications on file prior to encounter.   Current Outpatient Medications on File Prior to Encounter  Medication Sig Dispense Refill  . ibuprofen (ADVIL) 800 MG tablet Take 1 tablet (800 mg total) by mouth every 8 (eight) hours as needed. 30 tablet 0   Social History   Socioeconomic History  . Marital status: Married    Spouse name: Not on file  . Number of children: Not on file  . Years of education: Not on file  . Highest education level: Not on file  Occupational History  . Not on file  Tobacco Use  . Smoking status: Never Smoker  . Smokeless tobacco: Never Used  Vaping Use  . Vaping Use: Never used  Substance and Sexual Activity  . Alcohol use: No  . Drug use: Not on file  . Sexual activity: Not Currently  Other Topics Concern  . Not  on file  Social History Narrative  . Not on file   Social Determinants of Health   Financial Resource Strain: Not on file  Food Insecurity: Not on file  Transportation Needs: Not on file  Physical Activity: Not on file  Stress: Not on file  Social Connections: Not on file  Intimate Partner Violence: Not on file   Family History  Problem Relation Age of Onset  . Heart disease Father   . Diabetes Brother     OBJECTIVE:  Vitals:   09/27/20 1635  BP: 131/81  Pulse: 97  Resp: 16  Temp: 98.6 F (37 C)  TempSrc: Oral  SpO2: 94%     General appearance: alert; no distress Skin: three erythematous pustules to abdomen, bottom LT of abdomen with surrounding erythema, TTP, no obvious drainage or bleeding Psychological: alert and cooperative; normal mood and affect  ASSESSMENT & PLAN:  1. Insect bite of abdominal wall, initial encounter   2. Abdominal wall cellulitis     Meds ordered this encounter  Medications  . doxycycline (VIBRAMYCIN) 100 MG capsule    Sig: Take 1 capsule (100 mg total) by mouth 2 (two) times daily.    Dispense:  20 capsule    Refill:  0    Order Specific Question:   Supervising Provider  AnswerEustace Moore [[id number]]  . mupirocin ointment (BACTROBAN) 2 %    Sig: Apply 1 application topically 2 (two) times daily.    Dispense:  30 g    Refill:  0    Order Specific Question:   Supervising Provider    Answer:   Eustace Moore [[id number]]   Wash site daily with warm water and mild soap Keep covered to avoid friction Take antibiotic as prescribed and to completion Bactroban ointment prescribed.  Use as directed Follow up here or with PCP if symptoms persists Return or go to the ED if you have any new or worsening symptoms increased redness, swelling, pain, nausea, vomiting, fever, chills, etc...   Reviewed expectations re: course of current medical issues. Questions answered. Outlined signs and symptoms indicating need for more acute  intervention. Patient verbalized understanding. After Visit Summary given.          Rennis Harding, PA-C 09/27/20 1707

## 2020-09-27 NOTE — Discharge Instructions (Signed)
Wash site daily with warm water and mild soap Keep covered to avoid friction Take antibiotic as prescribed and to completion Bactroban ointment prescribed.  Use as directed Follow up here or with PCP if symptoms persists Return or go to the ED if you have any new or worsening symptoms increased redness, swelling, pain, nausea, vomiting, fever, chills, etc..Marland Kitchen

## 2020-09-30 ENCOUNTER — Ambulatory Visit: Payer: Commercial Managed Care - PPO | Admitting: Cardiology

## 2020-10-14 ENCOUNTER — Encounter (INDEPENDENT_AMBULATORY_CARE_PROVIDER_SITE_OTHER): Payer: Self-pay | Admitting: *Deleted

## 2020-11-25 ENCOUNTER — Ambulatory Visit: Payer: Commercial Managed Care - PPO | Admitting: Cardiology

## 2021-02-01 ENCOUNTER — Telehealth: Payer: Self-pay | Admitting: Orthopedic Surgery

## 2021-02-01 DIAGNOSIS — Z9889 Other specified postprocedural states: Secondary | ICD-10-CM

## 2021-08-06 ENCOUNTER — Emergency Department (HOSPITAL_COMMUNITY)
Admission: EM | Admit: 2021-08-06 | Discharge: 2021-08-06 | Disposition: A | Discharge status: Home / Self Care | Payer: Commercial Managed Care - PPO | Attending: Emergency Medicine | Admitting: Emergency Medicine

## 2021-08-06 ENCOUNTER — Emergency Department (HOSPITAL_COMMUNITY): Payer: Commercial Managed Care - PPO

## 2021-08-06 ENCOUNTER — Other Ambulatory Visit: Payer: Self-pay

## 2021-08-06 ENCOUNTER — Encounter (HOSPITAL_COMMUNITY): Payer: Self-pay

## 2021-08-06 DIAGNOSIS — R072 Precordial pain: Secondary | ICD-10-CM | POA: Diagnosis not present

## 2021-08-06 DIAGNOSIS — M546 Pain in thoracic spine: Secondary | ICD-10-CM | POA: Insufficient documentation

## 2021-08-06 MED ORDER — KETOROLAC TROMETHAMINE 60 MG/2ML IM SOLN
60.0000 mg | Freq: Once | INTRAMUSCULAR | Status: AC
  Administered 2021-08-06: 60 mg via INTRAMUSCULAR
  Filled 2021-08-06: qty 2

## 2021-08-06 MED ORDER — METHOCARBAMOL 500 MG PO TABS: 500.0000 mg | ORAL_TABLET | Freq: Two times a day (BID) | ORAL | 0 refills | Status: DC | PRN

## 2021-08-06 MED ORDER — NAPROXEN 500 MG PO TABS: 500.0000 mg | ORAL_TABLET | Freq: Two times a day (BID) | ORAL | 0 refills | Status: DC

## 2021-08-06 NOTE — ED Notes (Signed)
Patient Alert and oriented to baseline. Stable and ambulatory to baseline. Patient verbalized understanding of the discharge instructions.  Patient belongings were taken by the patient.   

## 2021-08-06 NOTE — Discharge Instructions (Signed)
Your x-rays show no signs of broken bones ?This is likely a muscle strain ?Please read the attached instructions ? ?Please take Naprosyn, 500mg  by mouth twice daily as needed for pain - this in an antiinflammatory medicine (NSAID) and is similar to ibuprofen - many people feel that it is stronger than ibuprofen and it is easier to take since it is a smaller pill.  Please use this only for 1 week - if your pain persists, you will need to follow up with your doctor in the office for ongoing guidance and pain control.   ?Please take Robaxin, 500 mg up to twice a day as needed for muscle spasm, this is a muscle relaxer, it may cause generalized weakness, sleepiness and you should not drive or do important things while taking this medication. ? ?

## 2021-08-06 NOTE — ED Triage Notes (Signed)
Pt presents to ED with worsening mid to lower right back pain for a couple of weeks. ?

## 2021-08-06 NOTE — ED Provider Notes (Signed)
?[facility] ?Provider Note ? ? ?CSN: [id number] ?Arrival date & time: 08/06/21  1275 ? ?  ? ?History ? ?Chief Complaint  ?Patient presents with  ? Back Pain  ? ? ?Russell Warren is a 61 y.o. male. ? ? ?Back Pain ?Associated symptoms: no fever   ? ?This patient is a 61 year old male, he denies chronic medical conditions and states he takes no daily medications.  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  He states that he had to try to get into some very small spaces and was doing some intense work and felt acute onset of pain in that area.  It then started to ease off and get better and even went away but after a few days ago when he was doing a similar strenuous type job the pain came back.  It is in the same location, it does not radiate, it does not cause any numbness or weakness of the legs and does not radiate to the legs.  It is in the right area around the ninth or 10th rib in the right posterior thorax.  There is no rash overlying this area and he has not had any fevers or chills.  This pain does get worse when he tries to rotate at the hips bend over to take a deep breath cough or sneeze.  He did take some ibuprofen to help with the pain this morning but it has not done very much.  His wife urged him to come to the emergency department for evaluation ? ?Home Medications ?Prior to Admission medications   ?Medication Sig Start Date End Date Taking? Authorizing Provider  ?methocarbamol (ROBAXIN) 500 MG tablet Take 1 tablet (500 mg total) by mouth 2 (two) times daily as needed for muscle spasms. 08/06/21  Yes Eber Hong, MD  ?naproxen (NAPROSYN) 500 MG tablet Take 1 tablet (500 mg total) by mouth 2 (two) times daily with a meal. 08/06/21  Yes Eber Hong, MD  ?    ? ?Allergies    ?Latex   ? ?Review of Systems   ?Review of Systems  ?Constitutional:  Negative for fever.  ?Musculoskeletal:  Positive for back pain.  ? ?Physical Exam ?Updated Vital Signs ?BP (!) 150/104 (BP Location: Left Arm)   Pulse 75   Temp 98.6 ?F (37 ?C) (Oral)   Resp 18   Ht 1.816 m (5' 11.5")   Wt 113.4 kg   SpO2 97%   BMI 34.38 kg/m?  ?Physical Exam ?Vitals and nursing note reviewed.  ?Constitutional:   ?   General: He is not in acute distress. ?   Appearance: He is well-developed.  ?HENT:  ?   Head: Normocephalic and atraumatic.  ?   Mouth/Throat:  ?   Pharynx: No oropharyngeal exudate.  ?Eyes:  ?   General: No scleral icterus.    ?   Right eye: No discharge.     ?   Left eye: No discharge.  ?   Conjunctiva/sclera: Conjunctivae normal.  ?   Pupils: Pupils are equal, round, and reactive to light.  ?Neck:  ?   Thyroid: No thyromegaly.  ?   Vascular: No JVD.  ?Cardiovascular:  ?   Rate and Rhythm: Normal rate and  regular rhythm.  ?   Heart sounds: Normal heart sounds. No murmur heard. ?  No friction rub. No gallop.  ?Pulmonary:  ?   Effort: Pulmonary effort is normal. No respiratory distress.  ?   Breath sounds: Normal breath sounds. No wheezing or rales.  ?Abdominal:  ?   General: Bowel sounds are normal. There is no distension.  ?   Palpations: Abdomen is soft. There is no mass.  ?   Tenderness: There is no abdominal tenderness.  ?Musculoskeletal:     ?   General: Tenderness present. Normal range of motion.  ?   Cervical back: Normal range of motion and neck supple.  ?   Right lower leg: No edema.  ?   Left lower leg: No edema.  ?   Comments: There is tenderness over the right posterior thoracic wall around the ninth or 10th rib, there is no rash overlying this, there is no crepitance or subcutaneous emphysema, there is no erythema or warmth to the skin  ?Lymphadenopathy:  ?   Cervical: No cervical adenopathy.  ?Skin: ?   General: Skin is warm and dry.  ?   Findings: No erythema or rash.   ?Neurological:  ?   General: No focal deficit present.  ?   Mental Status: He is alert.  ?   Coordination: Coordination normal.  ?   Comments: Normal gait, normal speech, normal use of all 4 extremities without any deficits, he does have pain with trying to move around on the bed  ?Psychiatric:     ?   Behavior: Behavior normal.  ? ? ?ED Results / Procedures / Treatments   ?Labs ?(all labs ordered are listed, but only abnormal results are displayed) ?Labs Reviewed - No data to display ? ?EKG ?None ? ?Radiology ?DG Ribs Unilateral W/Chest Right ? ?Result Date: 08/06/2021 ?CLINICAL DATA:  61 year old male with right rib pain for over a week. No known injury. EXAM: RIGHT RIBS AND CHEST - 3+ VIEW COMPARISON:  Chest radiographs 04/06/2019 and earlier. FINDINGS: Portable AP upright view of the chest at 0751 hours. Mildly lower lung volumes. Normal cardiac size and mediastinal contours. Visualized tracheal air column is within normal limits. Allowing for portable technique the lungs are clear. Paucity of bowel gas in the upper abdomen. 3 oblique portable views of the right ribs. Bone mineralization is within normal limits. No right rib fracture or rib lesion identified. Other visible osseous structures appear intact. IMPRESSION: 1. No right rib fracture or rib lesion identified. 2. No acute cardiopulmonary abnormality. Electronically Signed   By: Odessa Fleming M.D.   On: 08/06/2021 08:19   ? ?Procedures ?Procedures  ? ? ?Medications Ordered in ED ?Medications  ?ketorolac (TORADOL) injection 60 mg (60 mg Intramuscular Given 08/06/21 0740)  ? ? ?ED Course/ Medical Decision Making/ A&P ?  ?                        ?Medical Decision Making ?Amount and/or Complexity of Data Reviewed ?Radiology: ordered. ? ?Risk ?Prescription drug management. ? ? ?I suspect the patient has a muscular strain of his thoracic wall, given the type of work that he does in the mechanism by which this occurred this is most consistent.  He has no neurologic  symptoms no central spinal tenderness, no high risk features.  We will obtain x-rays of his ribs to make sure there is no underlying fracture of those ribs I suspect this is muscular strain.  He will be given Toradol and discharged home with a muscle relaxant as well as an anti-inflammatory.  The patient is agreeable to the plan ? ?Imaging: I personally viewed and interpreted the x-rays of the ribs, there is no signs of pneumothorax or rib fracture.  This is likely a musculoskeletal issue ? ?Medication management: The patient was given medication in the emergency department with improvement including Toradol, he will be prescribed both an anti-inflammatory and a muscle relaxant for home. ? ?ED course: Patient slightly improved, stable for discharge, no neurologic symptoms ? ? ? ? ? ? ? ?Final Clinical Impression(s) / ED Diagnoses ?Final diagnoses:  ?Acute right-sided thoracic back pain  ? ? ?Rx / DC Orders ?ED Discharge Orders   ? ?      Ordered  ?  methocarbamol (ROBAXIN) 500 MG tablet  2 times daily PRN       ? 08/06/21 0837  ?  naproxen (NAPROSYN) 500 MG tablet  2 times daily with meals       ? 08/06/21 0837  ? ?  ?  ? ?  ? ? ?  ?Eber Hong, MD ?08/06/21 [phone] ? ?

## 2021-08-21 ENCOUNTER — Ambulatory Visit
Admission: EM | Admit: 2021-08-21 | Discharge: 2021-08-21 | Disposition: A | Discharge status: Home / Self Care | Payer: Commercial Managed Care - PPO | Attending: Student | Admitting: Student

## 2021-08-21 ENCOUNTER — Other Ambulatory Visit: Payer: Self-pay

## 2021-08-21 DIAGNOSIS — L0291 Cutaneous abscess, unspecified: Secondary | ICD-10-CM

## 2021-08-21 MED ORDER — DOXYCYCLINE HYCLATE 100 MG PO CAPS: 100.0000 mg | ORAL_CAPSULE | Freq: Two times a day (BID) | ORAL | 0 refills | Status: AC

## 2021-08-21 NOTE — ED Provider Notes (Signed)
?[facility] ? ? ? ?CSN: [id number] ?Arrival date & time: 08/21/21  1639 ? ? ?  ? ?History   ?Chief Complaint ?Chief Complaint  ?Patient presents with  ? Abscess  ? ? ?HPI ?Russell Warren is a 61 y.o. male presenting with abscess on the right buttocks and back of neck for about 1 week.  History eczema.  States that he had a severe case of eczema 2 weeks ago, received a prednisone taper which improved the eczema, but then he developed abscesses on the back of the neck and the right buttock.  States that the right buttock abscess is draining already, it actually started draining copious bloody and purulent fluid at work today.  States that the abscess is on the back of the neck feel very tender and painful, but have not started draining yet.  He has been applying warm compresses to both areas.  Denies fever/chills.  States he is not immunocompromised.  ? ?HPI ? ?Past Medical History:  ?Diagnosis Date  ? Eczema   ? History of mumps   ? History of scarlet fever   ? HOH (hard of hearing)   ? Meningitis spinal   ? history of twice age 71 & [age]  ? ? ?Patient Active Problem List  ? Diagnosis Date Noted  ? S/P evacuation of hematoma left thigh 03/11/20 03/16/2020  ? Hematoma 01/20/2020  ? Dog bite 06/07/19 01/20/2020  ? Osteomyelitis (HCC) 02/26/2018  ? Foot pain, right 02/26/2018  ? Osteomyelitis of right foot (HCC) 02/26/2018  ? ? ?Past Surgical History:  ?Procedure Laterality Date  ? BACK SURGERY  1999  ? DENTAL SURGERY    ? HEMATOMA EVACUATION Left 03/11/2020  ? Procedure: EXCISION OF LEFT THIGH HEMATOMA;  Surgeon: Vickki HearingHarrison, Stanley E, MD;  Location: [facility];  Service: Orthopedics;  Laterality: Left;  ? MIDDLE EAR SURGERY Bilateral   ? ? ? ? ? ?Home Medications   ? ?Prior to Admission medications   ?Medication Sig Start Date End Date Taking? Authorizing Provider  ?doxycycline (VIBRAMYCIN) 100 MG capsule Take 1 capsule (100 mg total) by mouth 2 (two) times daily for 7 days. 08/21/21 08/28/21 Yes Rhys MartiniGraham, Ilynn Stauffer E, PA-C   ?methocarbamol (ROBAXIN) 500 MG tablet Take 1 tablet (500 mg total) by mouth 2 (two) times daily as needed for muscle spasms. 08/06/21   Eber HongMiller, Brian, MD  ?naproxen (NAPROSYN) 500 MG tablet Take 1 tablet (500 mg total) by mouth 2 (two) times daily with a meal. 08/06/21   Eber HongMiller, Brian, MD  ? ? ?Family History ?Family History  ?Problem Relation Age of Onset  ? Heart disease Father   ? Diabetes Brother   ? ? ?Social History ?Social History  ? ?Tobacco Use  ? Smoking status: Never  ? Smokeless tobacco: Never  ?Vaping Use  ? Vaping Use: Never used  ?Substance Use Topics  ? Alcohol use: No  ? ? ? ?Allergies   ?Latex ? ? ?Review of Systems ?Review of Systems  ?Skin:   ?     abscesses  ? ? ?Physical Exam ?Triage Vital Signs ?ED Triage Vitals [08/21/21 1833]  ?Enc Vitals Group  ?   BP (!) 156/101  ?   Pulse   ?   Resp 18  ?   Temp 97.7 ?F (36.5 ?C)  ?   Temp src   ?   SpO2 95 %  ?   Weight   ?   Height   ?   Head Circumference   ?  Peak Flow   ?   Pain Score   ?   Pain Loc   ?   Pain Edu?   ?   Excl. in GC?   ? ?No data found. ? ?Updated Vital Signs ?BP (!) 156/101   Temp 97.7 ?F (36.5 ?C)   Resp 18   SpO2 95%  ? ?Visual Acuity ?Right Eye Distance:   ?Left Eye Distance:   ?Bilateral Distance:   ? ?Right Eye Near:   ?Left Eye Near:    ?Bilateral Near:    ? ?Physical Exam ?Vitals reviewed.  ?Constitutional:   ?   General: He is not in acute distress. ?   Appearance: Normal appearance. He is not ill-appearing.  ?HENT:  ?   Head: Normocephalic and atraumatic.  ?Pulmonary:  ?   Effort: Pulmonary effort is normal.  ?Skin: ?   Comments: See image below.  ?Posterior neck with three small indurated abscesses with surrounding tenderness. No fluctuance or drainage.  ? ?Declines exam of buttocks.  ?Neurological:  ?   General: No focal deficit present.  ?   Mental Status: He is alert and oriented to person, place, and time.  ?Psychiatric:     ?   Mood and Affect: Mood normal.     ?   Behavior: Behavior normal.     ?   Thought Content:  Thought content normal.     ?   Judgment: Judgment normal.  ? ? ? ? ?UC Treatments / Results  ?Labs ?(all labs ordered are listed, but only abnormal results are displayed) ?Labs Reviewed - No data to display ? ?EKG ? ? ?Radiology ?No results found. ? ?Procedures ?Procedures (including critical care time) ? ?Medications Ordered in UC ?Medications - No data to display ? ?Initial Impression / Assessment and Plan / UC Course  ?I have reviewed the triage vital signs and the nursing notes. ? ?Pertinent labs & imaging results that were available during my care of the patient were reviewed by me and considered in my medical decision making (see chart for details). ? ?  ? ?This patient is a very pleasant 61 y.o. year old male presenting with multiple cutaneous abscesses. Afebrile, nontachy. Abscesses posterior neck are small and immature. Abscess R buttock has already drained copious purulent fluid at home per pt; he declines further drainage or exam of this area. Will manage with doxycycline and warm compresses today. He understands that doxycycline will make him photosensitive and to wear a hat and sunscreen while working. ED return precautions discussed. Patient verbalizes understanding and agreement.  ?.  ? ?Final Clinical Impressions(s) / UC Diagnoses  ? ?Final diagnoses:  ?Abscess of multiple sites  ? ? ? ?Discharge Instructions   ? ?  ?-Doxycycline twice daily for 7 days.  Make sure to wear sunscreen while spending time outside while on this medication as it can increase your chance of sunburn. You can take this medication with food if you have a sensitive stomach. ?-Wash with soap and water only  ?-Warm compresses to help your neck abscesses to start to drain  ?-Follow-up if abscesses getting larger and do not start draining on their own  ? ? ? ?ED Prescriptions   ? ? Medication Sig Dispense Auth. Provider  ? doxycycline (VIBRAMYCIN) 100 MG capsule Take 1 capsule (100 mg total) by mouth 2 (two) times daily for 7  days. 14 capsule Rhys Martini, PA-C  ? ?  ? ?PDMP not reviewed this encounter. ?  ?Cheree Ditto,  Lyman Speller, PA-C ?08/21/21 1900 ? ?

## 2021-08-21 NOTE — Discharge Instructions (Addendum)
-  Doxycycline twice daily for 7 days.  Make sure to wear sunscreen while spending time outside while on this medication as it can increase your chance of sunburn. You can take this medication with food if you have a sensitive stomach. ?-Wash with soap and water only  ?-Warm compresses to help your neck abscesses to start to drain  ?-Follow-up if abscesses getting larger and do not start draining on their own  ? ?

## 2021-08-21 NOTE — ED Triage Notes (Signed)
Pt  presents with abscess on right buttocks and back of neck for past week  ?

## 2021-11-25 ENCOUNTER — Emergency Department (HOSPITAL_COMMUNITY)
Admission: EM | Admit: 2021-11-25 | Discharge: 2021-11-25 | Disposition: A | Discharge status: Home / Self Care | Payer: Commercial Managed Care - PPO | Attending: Emergency Medicine | Admitting: Emergency Medicine

## 2021-11-25 ENCOUNTER — Emergency Department (HOSPITAL_COMMUNITY): Payer: Commercial Managed Care - PPO

## 2021-11-25 ENCOUNTER — Encounter (HOSPITAL_COMMUNITY): Payer: Self-pay | Admitting: *Deleted

## 2021-11-25 ENCOUNTER — Other Ambulatory Visit: Payer: Self-pay

## 2021-11-25 DIAGNOSIS — Z91040 Latex allergy status: Secondary | ICD-10-CM | POA: Diagnosis not present

## 2021-11-25 DIAGNOSIS — M7989 Other specified soft tissue disorders: Secondary | ICD-10-CM | POA: Diagnosis not present

## 2021-11-25 DIAGNOSIS — M79645 Pain in left finger(s): Secondary | ICD-10-CM | POA: Insufficient documentation

## 2021-11-25 DIAGNOSIS — R7309 Other abnormal glucose: Secondary | ICD-10-CM | POA: Diagnosis not present

## 2021-11-25 DIAGNOSIS — M79642 Pain in left hand: Secondary | ICD-10-CM | POA: Diagnosis present

## 2021-11-25 DIAGNOSIS — L03012 Cellulitis of left finger: Secondary | ICD-10-CM

## 2021-11-25 LAB — CBG MONITORING, ED: Glucose-Capillary: 101 mg/dL — ABNORMAL HIGH (ref 70–99)

## 2021-11-25 MED ORDER — LIDOCAINE HCL (PF) 1 % IJ SOLN
5.0000 mL | Freq: Once | INTRAMUSCULAR | Status: AC
  Administered 2021-11-25: 5 mL
  Filled 2021-11-25: qty 5

## 2021-11-25 MED ORDER — DOXYCYCLINE HYCLATE 100 MG PO CAPS: 100.0000 mg | ORAL_CAPSULE | Freq: Two times a day (BID) | ORAL | 0 refills | Status: DC

## 2021-11-25 NOTE — ED Provider Notes (Signed)
[facility] EMERGENCY DEPARTMENT Provider Note   CSN: [id number] Arrival date & time: 11/25/21  1042     History  Chief Complaint  Patient presents with   Insect Bite    Fb     Russell Warren is a 61 y.o. male.  HPI  Patient with medical history of eczema, spinal meningitis as a child, history of scarlet fever, frequent abscesses presents today due to left hand/finger pain.  2 days ago he thinks he was bit by a spider to his left pinky, does not remember being bit but noticed a small bump there.  Since 48 hours ago its been swelling, becoming red and when you push on it there is some purulent drainage.  Able to move it but this elicits pain.  Also thinks he has a splinter or metal body on index finger of left hand which was there since 3 days ago.  Unable to remove, also painful to palpation.  Denies paresthesias, fevers, history of diabetes.  Home Medications Prior to Admission medications   Medication Sig Start Date End Date Taking? Authorizing Provider  methocarbamol (ROBAXIN) 500 MG tablet Take 1 tablet (500 mg total) by mouth 2 (two) times daily as needed for muscle spasms. 08/06/21   Eber Hong, MD  naproxen (NAPROSYN) 500 MG tablet Take 1 tablet (500 mg total) by mouth 2 (two) times daily with a meal. 08/06/21   Eber Hong, MD      Allergies    Latex    Review of Systems   Review of Systems  Physical Exam Updated Vital Signs BP (!) 148/104 (BP Location: Right Arm)   Pulse 70   Temp 98.2 F (36.8 C) (Oral)   Resp 20   Ht 5\' 11"  (1.803 m)   Wt 106.7 kg   SpO2 95%   BMI 32.80 kg/m  Physical Exam Vitals and nursing note reviewed. Exam conducted with a chaperone present.  Constitutional:      General: He is not in acute distress.    Appearance: Normal appearance.  HENT:     Head: Normocephalic and atraumatic.  Eyes:     General: No scleral icterus.    Extraocular Movements: Extraocular movements intact.     Pupils: Pupils are equal, round, and reactive  to light.  Cardiovascular:     Pulses: Normal pulses.  Musculoskeletal:        General: Swelling and tenderness present. Normal range of motion.     Comments: ROM intact to each digit to the left upper extremity  Skin:    General: Skin is warm.     Capillary Refill: Capillary refill takes less than 2 seconds.     Coloration: Skin is not jaundiced.     Findings: Erythema present.  Neurological:     Mental Status: He is alert. Mental status is at baseline.     Coordination: Coordination normal.        ED Results / Procedures / Treatments   Labs (all labs ordered are listed, but only abnormal results are displayed) Labs Reviewed  CBG MONITORING, ED - Abnormal; Notable for the following components:      Result Value   Glucose-Capillary 101 (*)    All other components within normal limits    EKG None  Radiology DG Hand Complete Left  Result Date: 11/25/2021 CLINICAL DATA:  Pain. Patient reports pinky finger wound. Patient also reports "metal piece in left index finger". EXAM: LEFT HAND - COMPLETE 3+ VIEW COMPARISON:  None Available.  FINDINGS: There is no evidence of fracture or dislocation. Advanced arthropathy at the thumb carpal metacarpal joint with joint space narrowing, subchondral cysts and fragmented osteophytes. Milder osteoarthritis involving the metacarpal phalangeal joints of the thumb, index and middle fingers. Slight osteoarthritis of the distal interphalangeal joints of the digits. No erosion, periosteal reaction, bony destruction or focal lesion. Soft tissues are unremarkable. No soft tissue gas. No radiopaque foreign body. IMPRESSION: 1. No acute abnormality. 2. Osteoarthritis, most advanced at the thumb carpometacarpal joint. Electronically Signed   By: Narda Rutherford M.D.   On: 11/25/2021 11:55    Procedures Procedures    Medications Ordered in ED Medications  lidocaine (PF) (XYLOCAINE) 1 % injection 5 mL (5 mLs Infiltration Given 11/25/21 1226)    ED  Course/ Medical Decision Making/ A&P                           Medical Decision Making Amount and/or Complexity of Data Reviewed Radiology: ordered.  Risk Prescription drug management.   Patient presents today due to finger pain/possible infection.    Patient right-hand-dominant.  Neurovascular intact with brisk cap refill, there is circumferential swelling.  Erythema and warmth to left pinky, middle digit with possible fluctuance and purulent material.  There is no circumferential swelling, considered flexor tenosynovitis but do not think likely.  There is any pain to me deeper space infection, I did order an x-ray to evaluate for possible foreign bodies given patient is unsure about possible splinter or retained foreign body from work.  X-ray viewed and negative.  Considered I&D, patient unable to tolerate numbing so temp procedure was terminated prior to attempting.  Ultimately, I think he has cellulitis and possible purulence in his finger that may need I&D.  Discussed warm compresses, antibiotics and he is aware that he may require I&D in the future.  Patient was discharged in stable condition.  patient has had frequent abscesses and there is a possibility he may be MRSA colonized so will cover for MRSA with antibiotics.          Final Clinical Impression(s) / ED Diagnoses Final diagnoses:  None    Rx / DC Orders ED Discharge Orders     None         Theron Arista, Cordelia Poche 11/25/21 1304    Vanetta Mulders, MD 12/02/21 [phone]

## 2021-11-25 NOTE — ED Notes (Signed)
I&D tray at bedside.

## 2022-04-18 IMAGING — DX DG RIBS W/ CHEST 3+V*R*
4 series · 4 of 4 positions shown · non-contrast
Comparison: Chest radiographs 04/06/2019 and earlier.

CLINICAL DATA: 60-year-old male with right rib pain for over a
week. No known injury.

EXAM:
RIGHT RIBS AND CHEST - 3+ VIEW

[rib pa (1 of 2)]
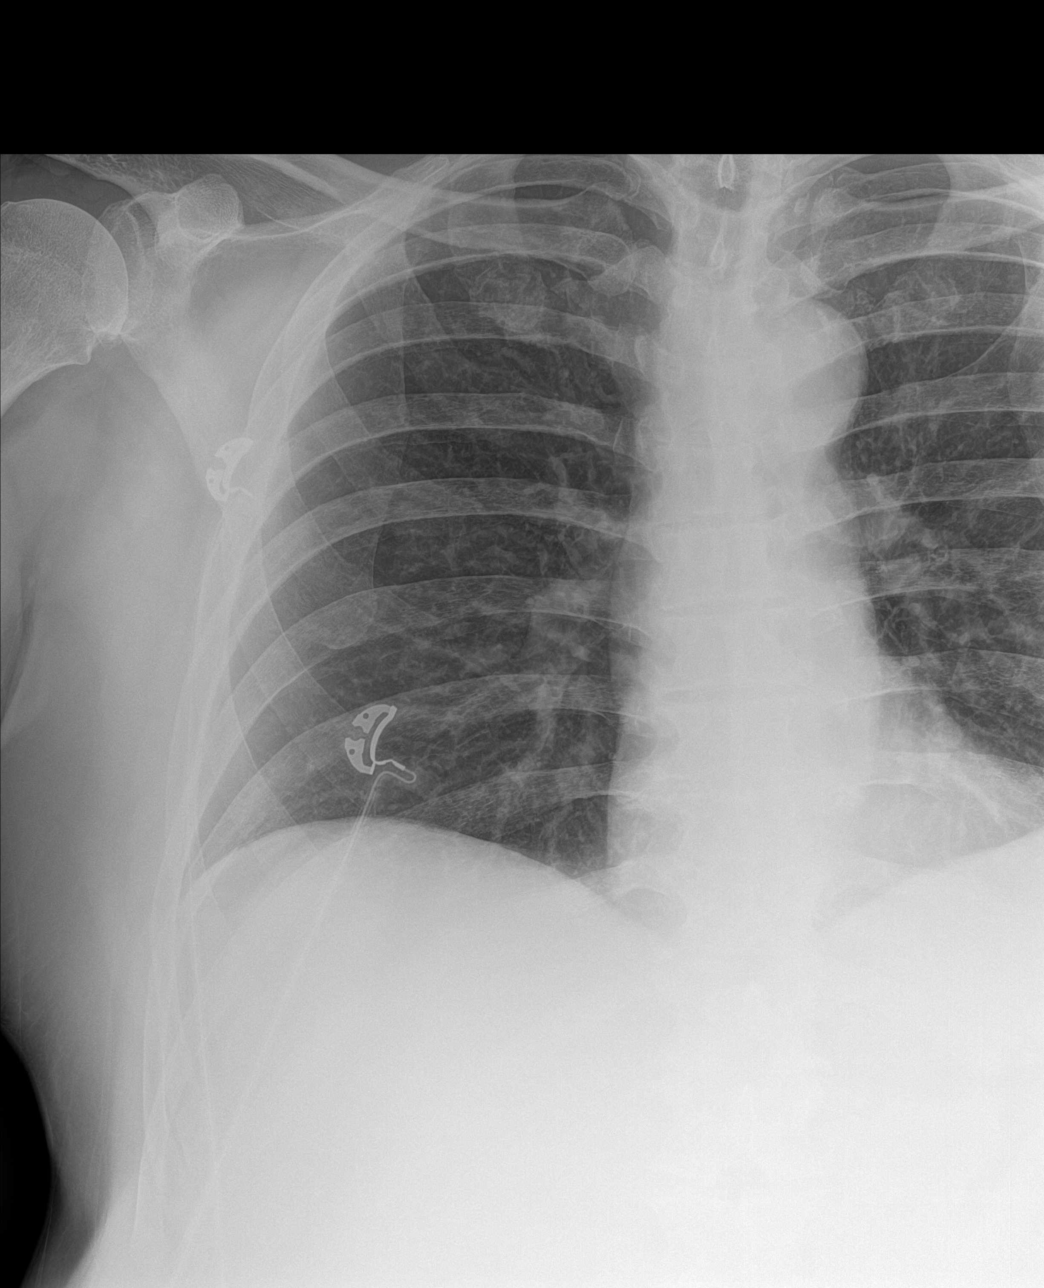

[rib obl]
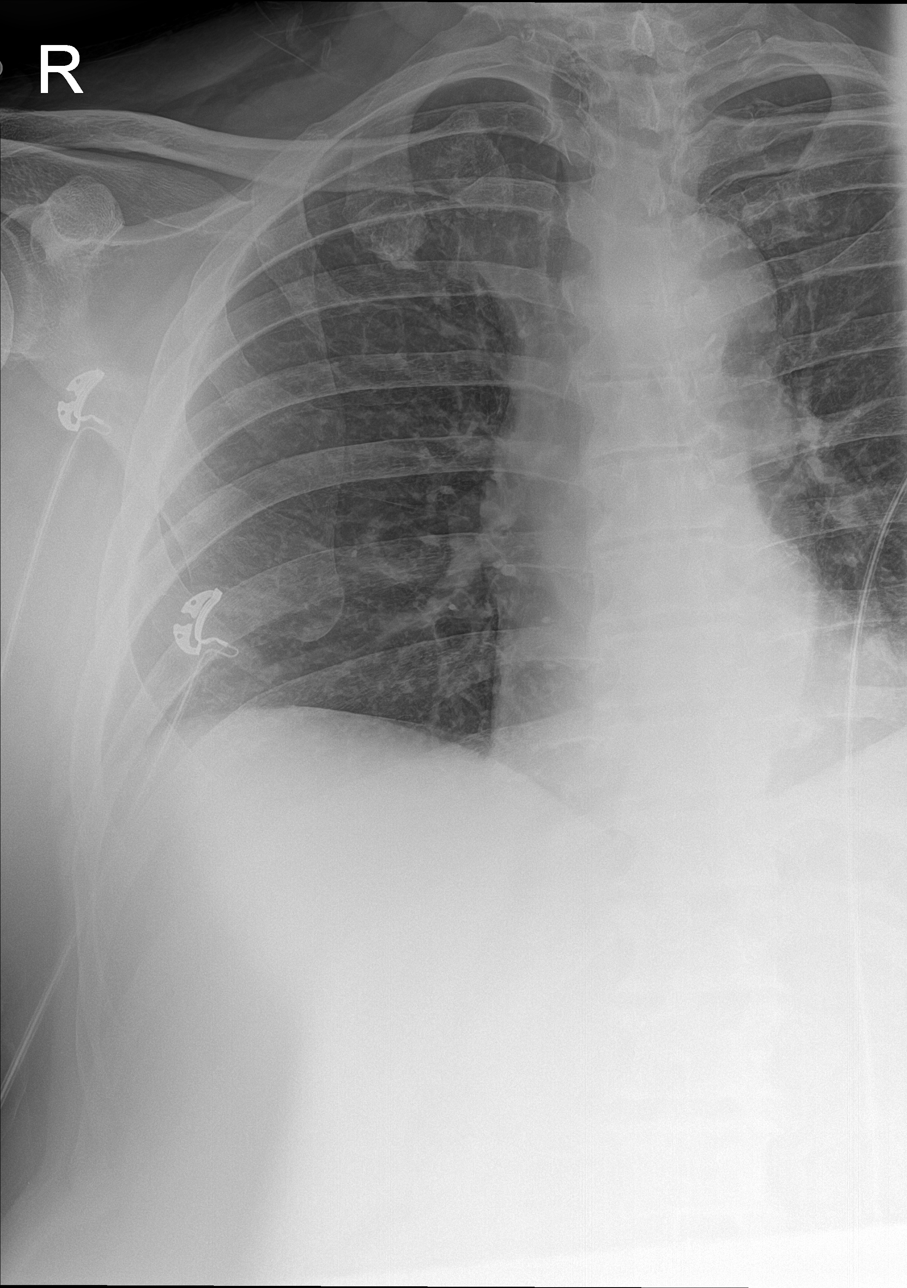

[chest ap]
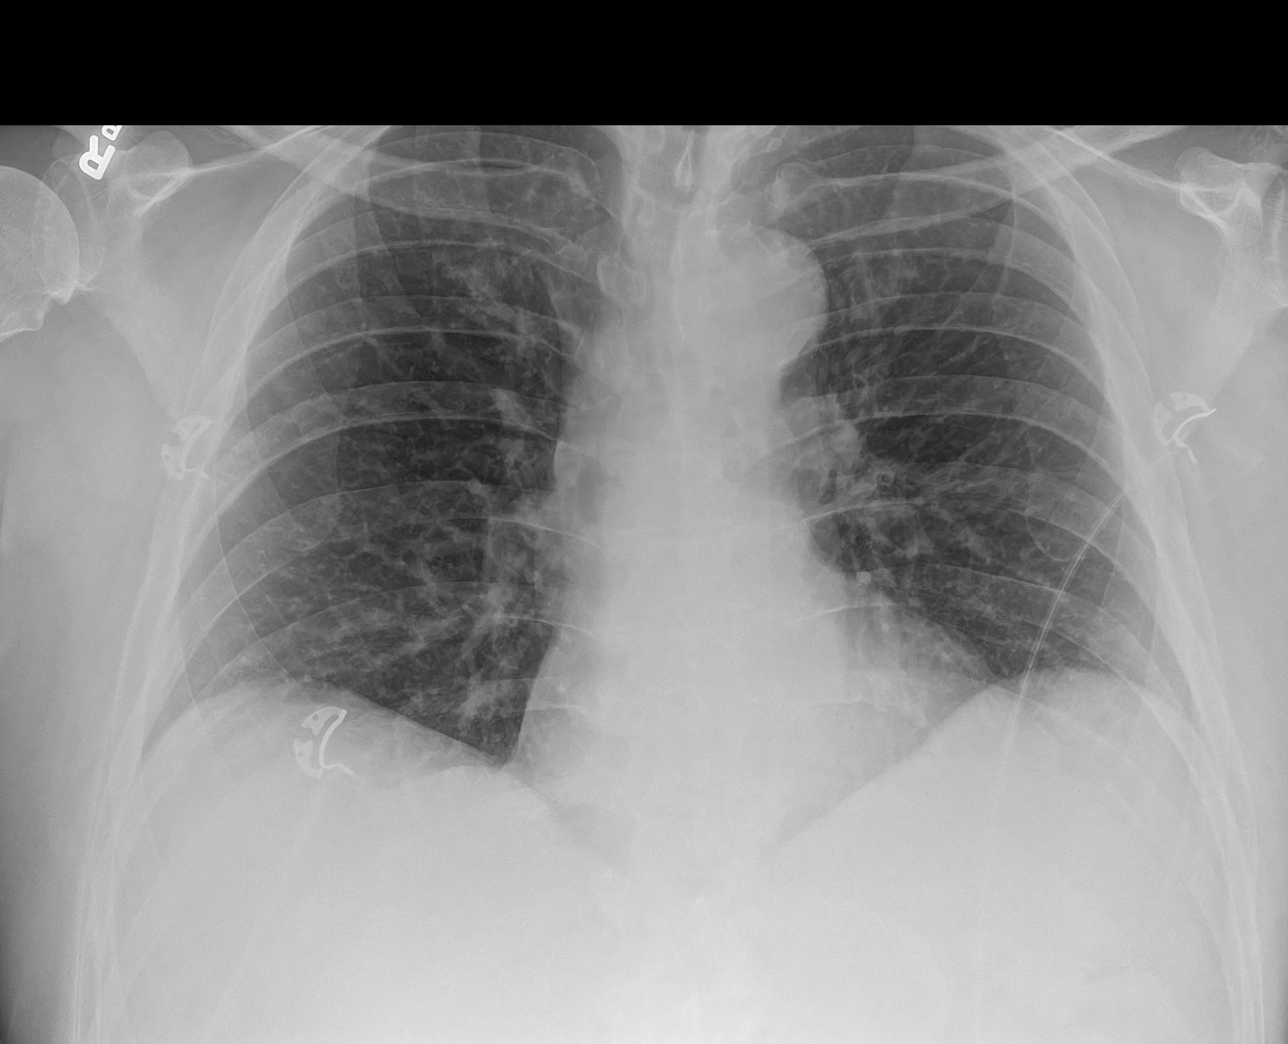

[rib pa (2 of 2)]
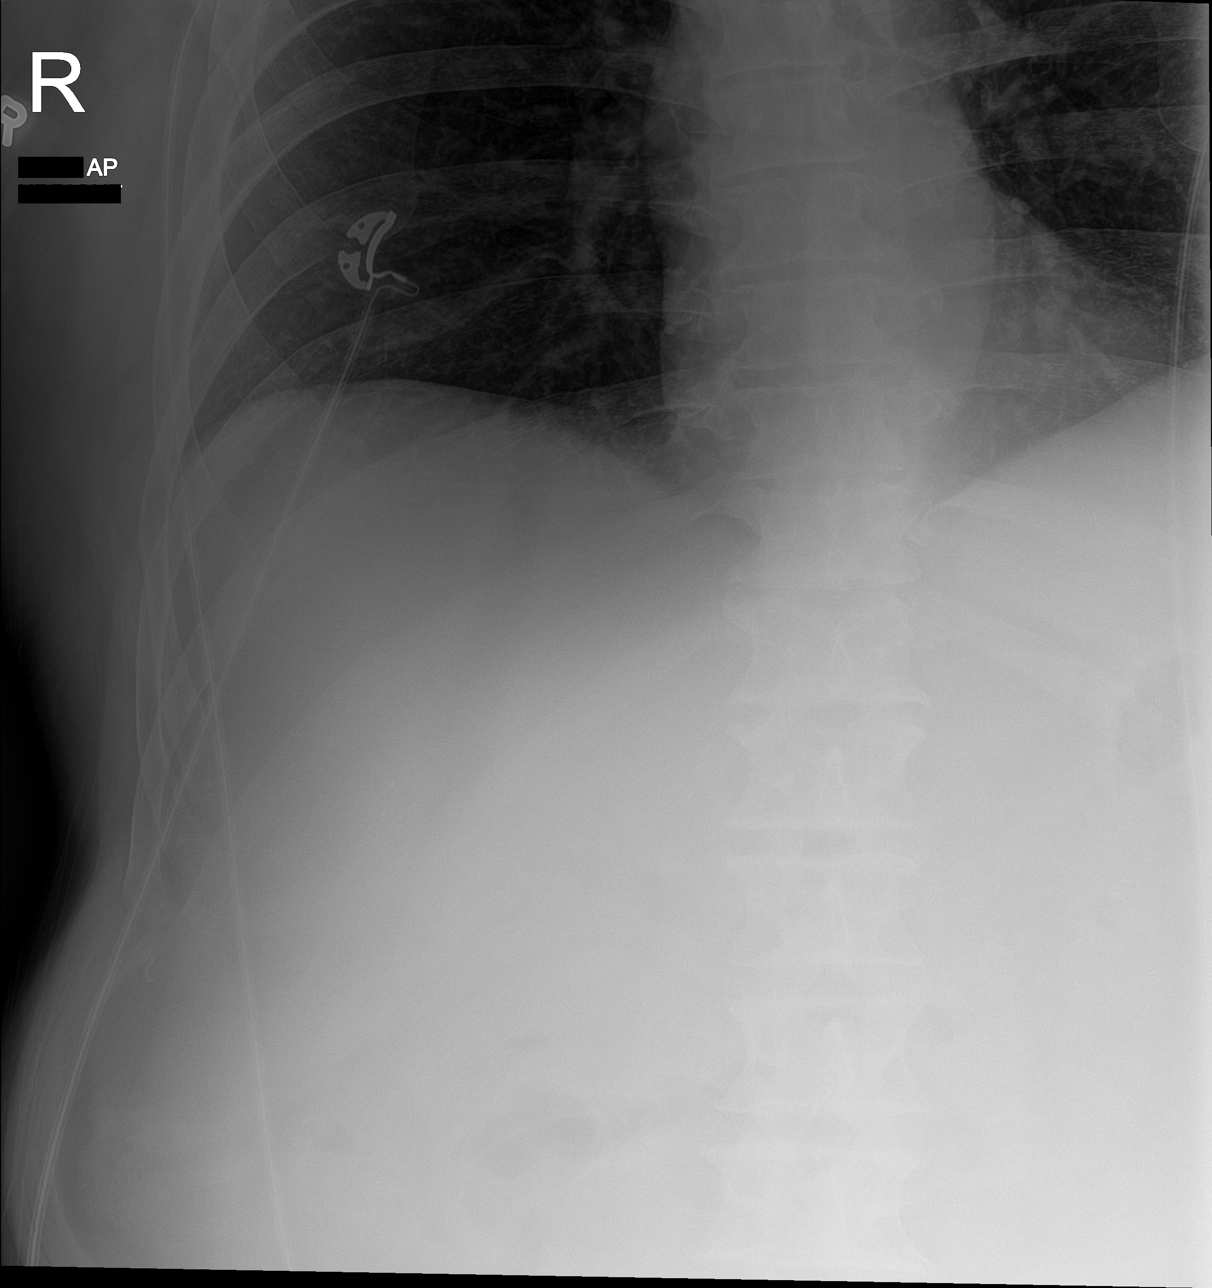

[4 of 4 positions shown; findings below may reference images not displayed]

FINDINGS: Portable AP upright view of the chest at 7288 hours. Mildly lower
lung volumes. Normal cardiac size and mediastinal contours.
Visualized tracheal air column is within normal limits. Allowing for
portable technique the lungs are clear. Paucity of bowel gas in the
upper abdomen.

3 oblique portable views of the right ribs. Bone mineralization is
within normal limits. No right rib fracture or rib lesion
identified. Other visible osseous structures appear intact.
IMPRESSION: 1. No right rib fracture or rib lesion identified.
2. No acute cardiopulmonary abnormality.

## 2023-03-22 ENCOUNTER — Ambulatory Visit: Payer: Commercial Managed Care - PPO

## 2023-03-22 ENCOUNTER — Ambulatory Visit
Admission: EM | Admit: 2023-03-22 | Discharge: 2023-03-22 | Disposition: A | Discharge status: Home / Self Care | Payer: Commercial Managed Care - PPO | Attending: Internal Medicine | Admitting: Internal Medicine

## 2023-03-22 DIAGNOSIS — R059 Cough, unspecified: Secondary | ICD-10-CM | POA: Diagnosis not present

## 2023-03-22 DIAGNOSIS — J0100 Acute maxillary sinusitis, unspecified: Secondary | ICD-10-CM | POA: Diagnosis not present

## 2023-03-22 MED ORDER — AZITHROMYCIN 250 MG PO TABS: 250.0000 mg | ORAL_TABLET | Freq: Every day | ORAL | 0 refills | Status: AC

## 2023-03-22 MED ORDER — BENZONATATE 100 MG PO CAPS: 100.0000 mg | ORAL_CAPSULE | Freq: Three times a day (TID) | ORAL | 0 refills | Status: AC

## 2023-03-22 NOTE — ED Triage Notes (Signed)
Patient here today with c/o productive cough, wheeze, SOB, Chest congestion, nasal congestion, runny nose, chills, ST, and hoarseness since last Tuesday or Wednesday. He has been taking Nyquil with some relief or his cough.

## 2023-03-22 NOTE — ED Provider Notes (Signed)
[facility]    CSN: [id number] Arrival date & time: 03/22/23  1140      History   Chief Complaint Chief Complaint  Patient presents with   Cough    HPI Russell Warren is a 62 y.o. male.   62 year old male who presents to urgent care with complaints of productive cough, wheezing, shortness of breath, chest and nose congestion, chills and hoarseness since last Tuesday or Wednesday. Started with a sore throat. His 5 year olds both had similar symptoms. Has had severe coughing episodes at night recently and his symptoms are worsening so he wanted to be evaluated. Some sinus pressure but no headaches recently. Some headaches early on but not now. No urinary symptoms but did have some diarrhea the last 2 mornings.    Cough Associated symptoms: chills, shortness of breath, sore throat and wheezing   Associated symptoms: no chest pain, no ear pain, no fever and no rash     Past Medical History:  Diagnosis Date   Eczema    History of mumps    History of scarlet fever    HOH (hard of hearing)    Meningitis spinal    history of twice age 25 & 40    Patient Active Problem List   Diagnosis Date Noted   S/P evacuation of hematoma left thigh 03/11/20 03/16/2020   Hematoma 01/20/2020   Dog bite 06/07/19 01/20/2020   Osteomyelitis (HCC) 02/26/2018   Foot pain, right 02/26/2018   Osteomyelitis of right foot (HCC) 02/26/2018    Past Surgical History:  Procedure Laterality Date   BACK SURGERY  1999   DENTAL SURGERY     HEMATOMA EVACUATION Left 03/11/2020   Procedure: EXCISION OF LEFT THIGH HEMATOMA;  Surgeon: Vickki Hearing, MD;  Location: [facility];  Service: Orthopedics;  Laterality: Left;   MIDDLE EAR SURGERY Bilateral        Home Medications    Prior to Admission medications   Not on File    Family History Family History  Problem Relation Age of Onset   Heart disease Father    Diabetes Brother     Social History Social History   Tobacco Use    Smoking status: Never   Smokeless tobacco: Never  Vaping Use   Vaping status: Never Used  Substance Use Topics   Alcohol use: No     Allergies   Latex   Review of Systems Review of Systems  Constitutional:  Positive for chills. Negative for appetite change and fever.  HENT:  Positive for congestion, sinus pressure and sore throat. Negative for ear pain.   Eyes:  Negative for pain and visual disturbance.  Respiratory:  Positive for cough, shortness of breath and wheezing.   Cardiovascular:  Negative for chest pain and palpitations.  Gastrointestinal:  Positive for diarrhea. Negative for abdominal pain and vomiting.  Genitourinary:  Negative for dysuria and hematuria.  Musculoskeletal:  Negative for arthralgias and back pain.  Skin:  Negative for color change and rash.  Neurological:  Negative for seizures and syncope.  All other systems reviewed and are negative.    Physical Exam Triage Vital Signs ED Triage Vitals [03/22/23 1249]  Encounter Vitals Group     BP (!) 152/110     Systolic BP Percentile      Diastolic BP Percentile      Pulse Rate 69     Resp 16     Temp 97.9 F (36.6 C)     Temp  Source Oral     SpO2 96 %     Weight 216 lb (98 kg)     Height 5\' 11"  (1.803 m)     Head Circumference      Peak Flow      Pain Score 4     Pain Loc      Pain Education      Exclude from Growth Chart    No data found.  Updated Vital Signs BP (!) 152/110 (BP Location: Right Arm)   Pulse 69   Temp 97.9 F (36.6 C) (Oral)   Resp 16   Ht 5\' 11"  (1.803 m)   Wt 216 lb (98 kg)   SpO2 96%   BMI 30.13 kg/m   Visual Acuity Right Eye Distance:   Left Eye Distance:   Bilateral Distance:    Right Eye Near:   Left Eye Near:    Bilateral Near:     Physical Exam Vitals and nursing note reviewed.  Constitutional:      General: He is not in acute distress.    Appearance: Normal appearance. He is well-developed.  HENT:     Head: Normocephalic and atraumatic.       Left Ear: Tympanic membrane normal.     Ears:     Comments: Scarred left TM, unable to visualize right but no purulence noticed bilateral     Mouth/Throat:     Pharynx: Posterior oropharyngeal erythema (mild) present.  Eyes:     Conjunctiva/sclera: Conjunctivae normal.  Cardiovascular:     Rate and Rhythm: Normal rate and regular rhythm.     Heart sounds: No murmur heard. Pulmonary:     Effort: Pulmonary effort is normal. No respiratory distress.     Breath sounds: Examination of the right-lower field reveals decreased breath sounds. Examination of the left-lower field reveals decreased breath sounds. Decreased breath sounds present.  Abdominal:     General: Bowel sounds are normal.     Palpations: Abdomen is soft.     Tenderness: There is no abdominal tenderness.  Musculoskeletal:        General: No swelling.     Cervical back: Neck supple.  Skin:    General: Skin is warm and dry.     Capillary Refill: Capillary refill takes less than 2 seconds.  Neurological:     General: No focal deficit present.     Mental Status: He is alert.  Psychiatric:        Mood and Affect: Mood normal.      UC Treatments / Results  Labs (all labs ordered are listed, but only abnormal results are displayed) Labs Reviewed - No data to display  EKG   Radiology No results found.  Procedures Procedures (including critical care time)  Medications Ordered in UC Medications - No data to display  Initial Impression / Assessment and Plan / UC Course  I have reviewed the triage vital signs and the nursing notes.  Pertinent labs & imaging results that were available during my care of the patient were reviewed by me and considered in my medical decision making (see chart for details).     Acute non-recurrent maxillary sinusitis   X-ray done today and final results will be available later today when the radiologist reads the x-ray.  On gross evaluation there does not appear to be a large  pneumonia.  Symptoms likely related to sinus infection or possible upper respiratory tract infection.  Will start the following treatment: Azithromax 2 tablets  today and 1 tablet daily for 4 days Tessalon pearls 1 capsule every 8 hours as needed for cough.  Blood pressure is elevated. You should follow up with PCP for ongoing management. Alarm symptoms that would warrant emergent evaluation include severe headache, chest pain, visual changes. Return to urgent care or PCP if symptoms worsen or fail to resolve.   Final Clinical Impressions(s) / UC Diagnoses   Final diagnoses:  None   Discharge Instructions   None    ED Prescriptions   None    PDMP not reviewed this encounter.   Landis Martins, [place] 03/22/23 1343

## 2023-03-22 NOTE — Discharge Instructions (Addendum)
X-ray done today and final results will be available later today when the radiologist reads the x-ray.  On gross evaluation there does not appear to be a large pneumonia.  Symptoms likely related to sinus infection or possible upper respiratory tract infection.  Will start the following treatment: Azithromax 2 tablets today and 1 tablet daily for 4 days Tessalon pearls 1 capsule every 8 hours as needed for cough.  Blood pressure is elevated. You should follow up with PCP for ongoing management. Alarm symptoms that would warrant emergent evaluation include severe headache, chest pain, visual changes. Return to urgent care or PCP if symptoms worsen or fail to resolve.

## 2024-04-16 ENCOUNTER — Other Ambulatory Visit: Payer: Self-pay | Admitting: General Surgery

## 2024-04-16 DIAGNOSIS — D1720 Benign lipomatous neoplasm of skin and subcutaneous tissue of unspecified limb: Secondary | ICD-10-CM

## 2024-05-14 ENCOUNTER — Ambulatory Visit: Admitting: Orthopaedic Surgery

## 2024-05-14 DIAGNOSIS — R2231 Localized swelling, mass and lump, right upper limb: Secondary | ICD-10-CM

## 2024-05-14 NOTE — Progress Notes (Signed)
 The patient is a 63 year old gentleman who reports a mass at his right shoulder area which has been there for a few years but he feels at this point it may be pressing on a nerve causing numbness and tingling in his right arm.  He denies any numbness and tingling.  He has actually already been set up for MRI of this area tomorrow to assess the mass.  He says a month ago when he made the appointment he was having significant symptoms but those are calming down but he still has periodic pain with numbness and tingling.  There is been no known injury.  He denies any weakness in his hands.  On exam I can palpate a mass at the posterior deltoid area of his proximal humerus area on the right side.  It is definitely in the soft tissue and firm.  There is not a Tinel sign over this area.  His neck exam is normal and he has excellent strength at his right upper extremity today and normal sensation.  There is no muscle atrophy in the arm or hand.  He will proceed with the MRI tomorrow and then we we will let him know what those results are and can go from there in terms of further treatment.  We will go ahead and have him establish a follow-up visit in a month from now and if there are issues before then we will let him know.  He agrees to this treatment plan.

## 2024-05-15 ENCOUNTER — Inpatient Hospital Stay: Admission: RE | Admit: 2024-05-15 | Discharge: 2024-05-15 | Attending: General Surgery | Admitting: General Surgery

## 2024-05-15 DIAGNOSIS — D1720 Benign lipomatous neoplasm of skin and subcutaneous tissue of unspecified limb: Secondary | ICD-10-CM

## 2024-05-15 MED ORDER — GADOPICLENOL 0.5 MMOL/ML IV SOLN
9.0000 mL | Freq: Once | INTRAVENOUS | Status: AC | PRN
  Administered 2024-05-15: 11:00:00 9 mL via INTRAVENOUS

## 2024-06-17 ENCOUNTER — Encounter: Payer: Self-pay | Admitting: Orthopaedic Surgery

## 2024-06-17 ENCOUNTER — Ambulatory Visit: Admitting: Orthopaedic Surgery

## 2024-06-17 DIAGNOSIS — R2231 Localized swelling, mass and lump, right upper limb: Secondary | ICD-10-CM | POA: Diagnosis not present

## 2024-06-17 NOTE — Progress Notes (Signed)
 The patient is a 64 year old gentleman who comes in to go over MRI of his right shoulder.  He has had a slow-growing mass just at the posterior aspect of the humeral head and deltoid area and he feels like occasionally it causes some numbness and tingling.  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  He denies any neck pain and denies any radicular type of pain down the arm.  He has had this type of symptoms before but they have gone away.  He denies any deep shoulder pain.  He said when he is very young he did have trauma to that shoulder and dislocated the shoulder and could not throw baseball for a very long period of time.  On exam his shoulder on the right side moves smoothly and fluidly with no weakness.  There is no weakness in any muscle groups of the right upper extremity at all.  There is a palpable mass in the posterior shoulder area at the subdeltoid area as well.  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  I did look at the imaging as well.  I did speak to my partner Dr. Addie who would be happy to see the patient in follow-up to consider surgery to remove the mass.  Will work on getting him a follow-up appointment with Dr. Addie.  The patient agrees with this as well.

## 2024-07-09 ENCOUNTER — Ambulatory Visit: Admitting: Orthopedic Surgery
